# Patient Record
Sex: Female | Born: 1935 | Race: White | Hispanic: No | Marital: Married | Smoking: Former smoker
Health system: Southern US, Community
[De-identification: ages and names within clinical notes are randomized; demographics above are authoritative.]

## PROBLEM LIST (undated history)

## (undated) DIAGNOSIS — I10 Essential (primary) hypertension: Secondary | ICD-10-CM

## (undated) DIAGNOSIS — I482 Chronic atrial fibrillation, unspecified: Secondary | ICD-10-CM

## (undated) DIAGNOSIS — E785 Hyperlipidemia, unspecified: Secondary | ICD-10-CM

## (undated) DIAGNOSIS — I471 Supraventricular tachycardia: Secondary | ICD-10-CM

## (undated) DIAGNOSIS — F0390 Unspecified dementia without behavioral disturbance: Secondary | ICD-10-CM

## (undated) DIAGNOSIS — I071 Rheumatic tricuspid insufficiency: Secondary | ICD-10-CM

## (undated) DIAGNOSIS — I4719 Other supraventricular tachycardia: Secondary | ICD-10-CM

## (undated) DIAGNOSIS — I351 Nonrheumatic aortic (valve) insufficiency: Secondary | ICD-10-CM

## (undated) DIAGNOSIS — R4189 Other symptoms and signs involving cognitive functions and awareness: Secondary | ICD-10-CM

## (undated) DIAGNOSIS — I340 Nonrheumatic mitral (valve) insufficiency: Secondary | ICD-10-CM

## (undated) HISTORY — DX: Other symptoms and signs involving cognitive functions and awareness: R41.89

## (undated) HISTORY — DX: Essential (primary) hypertension: I10

## (undated) HISTORY — DX: Nonrheumatic mitral (valve) insufficiency: I34.0

## (undated) HISTORY — DX: Other supraventricular tachycardia: I47.19

## (undated) HISTORY — DX: Rheumatic tricuspid insufficiency: I07.1

## (undated) HISTORY — DX: Unspecified dementia, unspecified severity, without behavioral disturbance, psychotic disturbance, mood disturbance, and anxiety: F03.90

## (undated) HISTORY — PX: APPENDECTOMY: SHX54

## (undated) HISTORY — DX: Nonrheumatic aortic (valve) insufficiency: I35.1

## (undated) HISTORY — PX: SHOULDER SURGERY: SHX246

## (undated) HISTORY — DX: Hyperlipidemia, unspecified: E78.5

## (undated) HISTORY — DX: Supraventricular tachycardia: I47.1

## (undated) HISTORY — DX: Chronic atrial fibrillation, unspecified: I48.20

---

## 2005-11-14 ENCOUNTER — Ambulatory Visit: Payer: Self-pay | Admitting: Internal Medicine

## 2005-11-18 ENCOUNTER — Ambulatory Visit: Payer: Self-pay | Admitting: Internal Medicine

## 2005-11-18 ENCOUNTER — Encounter: Payer: Self-pay | Admitting: Internal Medicine

## 2005-11-18 ENCOUNTER — Ambulatory Visit: Payer: Self-pay

## 2005-12-29 ENCOUNTER — Ambulatory Visit: Payer: Self-pay | Admitting: Internal Medicine

## 2006-06-27 ENCOUNTER — Ambulatory Visit: Payer: Self-pay | Admitting: Internal Medicine

## 2006-07-12 ENCOUNTER — Ambulatory Visit: Payer: Self-pay | Admitting: Internal Medicine

## 2006-12-06 ENCOUNTER — Ambulatory Visit: Payer: Self-pay | Admitting: Internal Medicine

## 2007-06-19 ENCOUNTER — Ambulatory Visit: Payer: Self-pay | Admitting: Internal Medicine

## 2007-06-27 ENCOUNTER — Ambulatory Visit: Payer: Self-pay

## 2007-12-10 ENCOUNTER — Ambulatory Visit: Payer: Self-pay | Admitting: Internal Medicine

## 2008-06-19 ENCOUNTER — Ambulatory Visit: Payer: Self-pay | Admitting: Internal Medicine

## 2008-07-02 ENCOUNTER — Encounter: Payer: Self-pay | Admitting: Internal Medicine

## 2008-07-02 ENCOUNTER — Ambulatory Visit: Payer: Self-pay | Admitting: Internal Medicine

## 2008-07-02 ENCOUNTER — Ambulatory Visit: Payer: Self-pay

## 2009-01-25 DIAGNOSIS — E785 Hyperlipidemia, unspecified: Secondary | ICD-10-CM

## 2009-01-25 DIAGNOSIS — I10 Essential (primary) hypertension: Secondary | ICD-10-CM

## 2009-01-25 DIAGNOSIS — I4891 Unspecified atrial fibrillation: Secondary | ICD-10-CM

## 2009-01-26 ENCOUNTER — Ambulatory Visit: Payer: Self-pay | Admitting: Internal Medicine

## 2009-07-20 ENCOUNTER — Telehealth: Payer: Self-pay | Admitting: Internal Medicine

## 2009-09-03 ENCOUNTER — Ambulatory Visit: Payer: Self-pay | Admitting: Internal Medicine

## 2009-09-03 DIAGNOSIS — R413 Other amnesia: Secondary | ICD-10-CM

## 2009-11-17 ENCOUNTER — Encounter: Payer: Self-pay | Admitting: Internal Medicine

## 2010-03-03 ENCOUNTER — Ambulatory Visit: Payer: Self-pay | Admitting: Internal Medicine

## 2010-03-03 DIAGNOSIS — M79609 Pain in unspecified limb: Secondary | ICD-10-CM | POA: Insufficient documentation

## 2010-03-04 ENCOUNTER — Encounter: Payer: Self-pay | Admitting: Internal Medicine

## 2010-03-19 ENCOUNTER — Telehealth: Payer: Self-pay | Admitting: Internal Medicine

## 2010-09-03 ENCOUNTER — Ambulatory Visit: Payer: Self-pay | Admitting: Internal Medicine

## 2010-09-03 DIAGNOSIS — R0602 Shortness of breath: Secondary | ICD-10-CM

## 2010-09-06 LAB — CONVERTED CEMR LAB: Pro B Natriuretic peptide (BNP): 154.6 pg/mL — ABNORMAL HIGH (ref 0.0–100.0)

## 2010-09-15 ENCOUNTER — Ambulatory Visit: Payer: Self-pay

## 2010-09-15 ENCOUNTER — Ambulatory Visit: Payer: Self-pay | Admitting: Internal Medicine

## 2010-09-15 ENCOUNTER — Ambulatory Visit (HOSPITAL_COMMUNITY)
Admission: RE | Admit: 2010-09-15 | Discharge: 2010-09-15 | Payer: Self-pay | Source: Home / Self Care | Attending: Internal Medicine | Admitting: Internal Medicine

## 2010-09-15 ENCOUNTER — Encounter: Payer: Self-pay | Admitting: Internal Medicine

## 2010-11-02 NOTE — Letter (Signed)
Summary: [facility] Office Note  [facility] Office Note   Imported By: Roderic Ovens 12/04/2009 14:25:08  _____________________________________________________________________  External Attachment:    Type:   Image     Comment:   External Document

## 2010-11-02 NOTE — Assessment & Plan Note (Signed)
Summary: f41m    Visit Type:  Follow-up Primary Provider:  Dr. Nedra Hai   History of Present Illness: [name] is a delightful 77 year old woman with a history of chronic atrial fibrillation, hypertension, hyperlipidemia, and diabetes. She returns today for routine followup.   At last visit stopped digoxin. Doing very well. No CP. Remains active but gets SOB if she tries to walk too fast. No palpitations. No HF signs. Coumadin followed by Dr. Nedra Hai. No bleeding. Walking limited due to pain in knee. No claudication. Recently started on Namenda. BP doing well.   Had blood work last week with Dr. Nedra Hai and said it was ok.   Current Medications (verified): 1)  Warfarin Sodium .... Use As Directed By Anticoagualtion Clinic 2)  Hydrochlorothiazide 25 Mg Tabs (Hydrochlorothiazide) .... Take One Tablet By Mouth Daily. 3)  Zocor 40 Mg Tabs (Simvastatin) .Marland Kitchen.. 1 Once Daily 4)  Potassium Chloride Crys Cr 20 Meq Cr-Tabs (Potassium Chloride Crys Cr) .... Take One Tablet By Mouth Daily 5)  Metoprolol Tartrate 100 Mg Tabs (Metoprolol Tartrate) .... Two Times A Day 6)  Fish Oil   Oil (Fish Oil) .Marland Kitchen.. 1 By Mouth Daily 7)  Namenda 10 Mg Tabs (Memantine Hcl) .... Take 1 By Mouth Once Daily 8)  Tylenol Arthritis Pain 650 Mg Cr-Tabs (Acetaminophen) .... Take 2 By Mouth Once Daily 9)  Losartan Potassium 25 Mg Tabs (Losartan Potassium) .... Take 1 Tablet By Mouth Once A Day  Allergies: 1)  ! Jonne Ply  Past History:  Past Medical History: Last updated: 01/25/2009  1. Chronic atrial fibrillation     --Echo 9/09 EF 60%. mild MR  2. HTN  3. Hyperlipidemia  4. Diabetes  5. Myoview 2008: EF 70% normal perfusion  Review of Systems       As per HPI and past medical history; otherwise all systems negative.   Vital Signs:  Patient profile:   77 year old female Height:      64 inches Weight:      159 pounds BMI:     27.39 Pulse rate:   96 / minute Pulse rhythm:   irregularly irregular BP sitting:   124 / 62  (left  arm)  Vitals Entered By: Laurance Flatten CMA (September 03, 2010 11:41 AM)  Physical Exam  General:  Well appearing. no resp difficulty HEENT: normal Neck: supple. JVP 6. Carotids 2+ bilat; no bruits.  Cor: PMI nondisplaced. Irregular rate & rhythm. No rubs, gallops, murmur. Lungs: clear Abdomen: soft, nontender, nondistended. No hepatosplenomegaly. No bruits or masses. Good bowel sounds. Extremities: no cyanosis, clubbing, rash,+varicose veins  Neuro: alert & orientedx3, cranial nerves grossly intact. moves all 4 extremities w/o difficulty. affect pleasant    Impression & Recommendations:  Problem # 1:  ATRIAL FIBRILLATION (ICD-427.31) Chronic. Stable. Rate controlled. Continue coumadin.  Problem # 2:  DYSPNEA (ICD-786.05) Unclear etiology. Will check echo, CXR, TSH, and BNP.   Problem # 3:  HYPERTENSION, BENIGN (ICD-401.1) Blood pressure well controlled. Continue current regimen.  Other Orders: EKG w/ Interpretation (93000) Echocardiogram (Echo) T-2 View CXR (71020TC) TLB-BNP (B-Natriuretic Peptide) (83880-BNPR)  Patient Instructions: 1)  Your physician recommends that you schedule a follow-up appointment in: 6 months 2)  Your physician recommends that you continue on your current medications as directed. Please refer to the Current Medication list given to you today.  Appended Document: f27m    Clinical Lists Changes  Observations: Added new observation of PI CARDIO: Your physician recommends that you schedule a follow-up appointment in:  6 months Your physician recommends that you continue on your current medications as directed. Please refer to the Current Medication list given to you today. A chest x-ray takes a picture of the organs and structures inside the chest, including the heart, lungs, and blood vessels. This test can show several things, including, whether the heart is enlarged; whether fluid is building up in the lungs; and whether pacemaker / defibrillator  leads are still in place. You have prescription to have this done at [facility] Your physician has requested that you have an echocardiogram.  Echocardiography is a painless test that uses sound waves to create images of your heart. It provides your doctor with information about the size and shape of your heart and how well your heart's chambers and valves are working.  This procedure takes approximately one hour. There are no restrictions for this procedure. (09/03/2010 12:39)       Patient Instructions: 1)  Your physician recommends that you schedule a follow-up appointment in: 6 months 2)  Your physician recommends that you continue on your current medications as directed. Please refer to the Current Medication list given to you today. 3)  A chest x-ray takes a picture of the organs and structures inside the chest, including the heart, lungs, and blood vessels. This test can show several things, including, whether the heart is enlarged; whether fluid is building up in the lungs; and whether pacemaker / defibrillator leads are still in place. You have prescription to have this done at [facility] 4)  Your physician has requested that you have an echocardiogram.  Echocardiography is a painless test that uses sound waves to create images of your heart. It provides your doctor with information about the size and shape of your heart and how well your heart's chambers and valves are working.  This procedure takes approximately one hour. There are no restrictions for this procedure.  Appended Document: f44m Pt to have TSH done on September 15, 2010 when here for echo

## 2010-11-02 NOTE — Assessment & Plan Note (Signed)
Summary: f12m   Primary Provider:  Dr. Nedra Hai  CC:  ROV; No complaints.  History of Present Illness: [name] is a delightful 77 year old woman with a history of chronic atrial fibrillation, hypertension, hyperlipidemia, and diabetes. She returns today for routine followup.   Doing very well. No CP/SOB. Remains active. No palpitations. No HF signs. Coumadin followed by Dr. Nedra Hai. No bleeding. Having some vein work on her left leg. Walking limited due to pain in L shin. No claudication. Recently started on Namenda. BP doing well.   Current Medications (verified): 1)  Digoxin 0.125 Mg Tabs (Digoxin) .Marland Kitchen.. 1 Once Daily 2)  Warfarin Sodium .... Use As Directed By [facility]ticoag[facility]tion Clinic 3)  Hydrochlorothiazide 25 Mg Tabs (Hydrochlorothiazide) .... Take One Tablet By Mouth Daily. 4)  Zocor 40 Mg Tabs (Simvastatin) .Marland Kitchen.. 1 Once Daily 5)  Potassium Chloride Crys Cr 20 Meq Cr-Tabs (Potassium Chloride Crys Cr) .... Take One Tablet By Mouth Daily 6)  Metoprolol Tartrate 100 Mg Tabs (Metoprolol Tartrate) .... Two Times A Day 7)  Fish Oil   Oil (Fish Oil) .Marland Kitchen.. 1 By Mouth Daily 8)  Namenda 10 Mg Tabs (Memantine Hcl) .... Take 1 By Mouth Once Daily 9)  Tylenol Arthritis Pain 650 Mg Cr-Tabs (Acetaminophen) .... Take 2 By Mouth Once Daily  Allergies: 1)  ! Jonne Ply  Past History:  Past Medical History: Last updated: 01/25/2009  1. Chronic atrial fibrillation     --Echo 9/09 EF 60%. mild MR  2. HTN  3. Hyperlipidemia  4. Diabetes  5. Myoview 2008: EF 70% normal perfusion  Review of Systems       As per HPI and past medical history; otherwise all systems negative.   Vital Signs:  Patient profile:   77 year old female Height:      64 inches Weight:      160 pounds BMI:     27.56 Pulse rate:   76 / minute Pulse rhythm:   irregular BP sitting:   152 / 64  (left arm) Cuff size:   regular  Vitals Entered By: Stanton Kidney, EMT-P (March 03, 2010 11:36 AM)  Physical Exam  General:  Gen: well appearing.  no resp difficulty HEENT: normal Neck: supple. no JVD. Carotids 2+ bilat; no bruits.  Cor: PMI nondisplaced. Irregular rate & rhythm. No rubs, gallops, murmur. Lungs: clear Abdomen: soft, nontender, nondistended. No hepatosplenomegaly. No bruits or masses. Good bowel sounds. Extremities: no cyanosis, clubbing, rash,+varicose veins L > R. now with compression stocking on L leg. tender to palption on left shin Neuro: alert & orientedx3, cranial nerves grossly intact. moves all 4 extremities w/o difficulty. affect pleasant    Impression & Recommendations:  Problem # 1:  ATRIAL FIBRILLATION (ICD-427.31) Chronic. Stable. Will stop digoxin. Continue coumadin.  Problem # 2:  HYPERTENSION, BENIGN (ICD-401.1) BP up a bit. Start cozaar 25 once daily .Check BMET in 1 week.   Problem # 3:  LIMB PAIN (ICD-729.5) Will check LLE x-ray. If pain persists send to ortho.   Other Orders: EKG w/ Interpretation (93000)  Patient Instructions: 1)  Stop Digoxin 2)  Start Losartan 25mg  daily 3)  We have given you a prescription for an x-ray of your leg to have doen in [place] 4)  Follow up in 6 months Prescriptions: LOSARTAN POTASSIUM 25 MG TABS (LOSARTAN POTASSIUM) Take 1 tablet by mouth once a day  #90 x 3   Entered by:   Meredith Staggers, RN   Authorized by:   Dolores Patty,  MD, [facility]   Signed by:   Meredith Staggers, RN on 03/03/2010   Method used:   Electronically to        [facility]* (mail-order)             ,          Ph: [phone]       Fax: [phone]   RxID:   [id number]

## 2010-11-02 NOTE — Progress Notes (Signed)
Summary: RESULTS FROM XRAY OF LEG   Phone Note Call from Patient Call back at Home Phone [phone]   Caller: Patient Summary of Call: PT CALLING FOR XRAY RESULTS OF THE PT LEG Initial call taken by: Judie Grieve,  March 19, 2010 11:37 AM  Follow-up for Phone Call        pt had xray at [facility], will call for results Meredith Staggers, RN  March 19, 2010 3:13 PM   Received xray report, shows no acute findings, degenerative joint disease, called pt and left mess to call back Meredith Staggers, RN  March 22, 2010 9:24 AM   pt aware of results Meredith Staggers, RN  March 22, 2010 1:25 PM

## 2011-02-11 ENCOUNTER — Other Ambulatory Visit: Payer: Self-pay | Admitting: Internal Medicine

## 2011-02-15 NOTE — Assessment & Plan Note (Signed)
[facility] HEALTHCARE                            CARDIOLOGY OFFICE NOTE   [name], [name]                       MRN:          [id number]  DATE:06/19/2008                            DOB:          July 24, 1925    PRIMARY CARE PHYSICIAN:  Trellis Paganini, MD   HISTORY:  [name] is a delightful 77 year old woman with a history of  chronic atrial fibrillation, hypertension, hyperlipidemia, and diabetes.  She returns today for routine followup.   Overall, she continues to do great.  She is active with mall walking  with no chest pain, dyspnea, or palpitations.  She has not had any  syncope or presyncope.  She had a Myoview back in September 2008, which  was normal.  Ejection fraction has been 70% by echocardiogram.   At her last visit, we did increase her Toprol to 75 b.i.d. as her  resting heart rate was in the mid 90s.   CURRENT MEDICATIONS:  1. Digoxin 125 mcg a day.  2. Coumadin.  3. Hydrochlorothiazide 25 a day.  4. Zocor 40 a day.  5. Potassium 20 a day.  6. Toprol 75 b.i.d.   PHYSICAL EXAMINATION:  GENERAL:  She is well appearing in no acute  distress, ambulates around the clinic without any respiratory  difficulty.  She looks younger than her stated age.  VITAL SIGNS:  Blood pressure is 125/63, heart rate is 101, and weight is  159.  HEENT:  Normal.  NECK:  Supple.  No JVD.  Carotids are 2+ bilaterally without any bruits.  There is no lymphadenopathy or thyromegaly.  CARDIAC:  PMI is nondisplaced.  She is irregularly regular.  No murmurs,  rubs, or gallops.  LUNGS:  Clear.  ABDOMEN:  Soft, nontender, and nondistended.  No hepatosplenomegaly, no  bruits, or no masses.  Good bowel sounds.  EXTREMITIES:  Warm with no cyanosis, clubbing, or edema.  No rash.  Trace lower extremity edema.  NEUROLOGICAL:  Alert and oriented x3.  Cranial nerves II through XII are  intact.  Moves all four extremities without difficulty.  Affect is very  pleasant.   EKG  shows atrial fibrillation at a rate of 101.  There is LVH and  occasional PVCs.  Nonspecific ST-T wave abnormalities.   ASSESSMENT AND PLAN:  1. Atrial fibrillation.  Once again her rate is elevated.  We will go      ahead and increase her Toprol to 100 b.i.d. and put a 48-hour      Holter monitor on her and see if this is same at home or just      because she is here in the office.  If her rates continued to be      elevated, I think, she will probably need to start some Cardizem.      We are also going to check an echocardiogram to make sure her left      ventricular function is stable.  2. Hyperlipidemia.  We will get a fasting lipid panel.  3. Hypertension.  Blood pressure looks great.  Continue to follow.   DISPOSITION:  Overall, doing very well.  We will see her back in 6  months.  Further disposition pending the results of her testing.     [name]. Bensimhon, MD  Electronically Signed    DRB/MedQ  DD: 06/19/2008  DT: 06/20/2008  Job #: [id number]   cc:   [name]

## 2011-02-15 NOTE — Assessment & Plan Note (Signed)
[facility]ALTHCARE                            CARDIOLOGY OFFICE NOTE   NAME:[name] [name]                       MRN:          [id number]  DATE:06/19/2007                            DOB:          [birth year]    PRIMARY CARE PHYSICIAN:  Dr. Simone [name], in [place].   INTERVAL HISTORY:  [name] [name] is a delightful 77 year old woman with a  history of chronic atrial fibrillation, hypertension, hyperlipidemia and  diabetes who returns today for routine followup.   [name] is doing great.  [name] remains very active, mall walking with her  husband about 2 miles a day.  No chest pain or shortness of breath.  [name]  has not had any syncope or presyncope.  [name] has had good heart rate  control with her A-fib.  [name] did have a Myoview about 6 years ago which  was negative for ischemia.   CURRENT MEDICATIONS:  1. Digoxin 125 mcg a day.  2. Coumadin.  3. Toprol 100 a day.  4. HCTZ 25 a day.  5. Zocor 40 a day.  6. Potassium 20 a day.   PHYSICAL EXAM:  [name] is an elderly woman in no acute distress.  Ambulates  around the clinic without any respiratory difficulty.  [name] appears  younger than her stated age.  Blood pressure is 127/74, heart rate is  89, weight is 160.  HEENT:  Normal.  NECK:  Supple, no JVD.  Carotids are 2+ bilaterally without bruits.  There is no lymphadenopathy or thyromegaly.  CARDIAC:  PMI is nondisplaced.  [name] is irregularly irregular with no  murmurs, rubs or gallops.  LUNGS:  Clear.  ABDOMEN:  Soft, nontender, nondistended, good bowel sounds, no  hepatosplenomegaly, no bruits, no masses.  EXTREMITIES:  Warm with no cyanosis, clubbing or edema.  No rashes.  NEURO:  [name] is alert and oriented x3, cranial nerves II through XII are  intact and moves all 4 extremities without difficulty.   EKG shows chronic atrial fibrillation at a rate of 89 bpm with  nonspecific ST-T wave abnormalities and also occasional PVC.   ASSESSMENT AND PLAN:  1. Atrial  fibrillation.  [name] is doing well, continue rate control and      Coumadin.  This is followed by [name] [name].  2. Hypertension.  Blood pressure is well controlled today,  continue      current therapy.  3. Cardiovascular risk screening.  We will proceed with a repeat      Myoview given the fact that her last test was over 5 years ago and      [name] has significant risk factors.   DISPOSITION:  I will see her back for routine followup in 6 months.     [name] Buckles. Bensimhon, MD  Electronically Signed    DRB/MedQ  DD: 06/19/2007  DT: 06/19/2007  Job #: [id number]   cc:   [name] [name]

## 2011-02-15 NOTE — Assessment & Plan Note (Signed)
[facility] HEALTHCARE                            CARDIOLOGY OFFICE NOTE   [name], [name]                       MRN:          [id number]  DATE:12/10/2007                            DOB:          Jan 30, 1925    PRIMARY CARE PHYSICIAN:  Simone Curia, M.D.   INTERVAL HISTORY:  [name] is a delightful 77 year old woman with a  history of chronic atrial fibrillation, hypertension, hyperlipidemia and  diabetes who returns today for routine follow up.   Overall, she is doing great.  She remains very active, walking in the  mall, at least 2 miles 4-5 days a week.  No chest pain or shortness of  breath.  No palpitations.  No syncope or presyncope.  She did have a  recent screening Myoview back in September of 2008 which  did not show  any evidence of scar or ischemia.  It was not gated due to her atrial  fibrillation.  Echocardiogram in 2007 showed an EF of 70%.   CURRENT MEDICATIONS:  1. Digoxin 125 mcg a day.  2. Warfarin.  3. Toprol 100 mg a day.  4. HCTZ 25 a day.  5. Zocor 40 a day.  6. Potassium 20 a day.   PHYSICAL EXAMINATION:  GENERAL:  She is in no acute distress.  Ambulates  around the clinic without any respiratory difficulty.  She looks younger  than her stated age.  VITAL SIGNS:  Blood pressure is 136/78, heart rate is 98, weights is  158.  HEENT:  Normal.  NECK:  Supple.  There is no JVD.  Carotids are 2+ bilaterally without  any bruits.  There is no lymphadenopathy or thyromegaly.  CARDIAC:  PMI is not displaced.  She is irregularly irregular.  No  murmurs, rubs or gallops.  LUNGS:  Clear.  ABDOMEN:  Soft, nontender, nondistended.  There is no  hepatosplenomegaly, no bruits, no masses.  Good bowel sounds.  EXTREMITIES:  Warm with no cyanosis, clubbing or edema.  No rash.  NEUROLOGIC:  Alert and oriented x3.  Cranial nerves II-XII are intact.  Moves all 4 extremities without difficulty.  Affect is pleasant.   ELECTROCARDIOGRAM:  EKG shows  chronic atrial fibrillation at a rate of  92 with nonspecific ST-T wave abnormalities.   ASSESSMENT/PLAN:  1. Atrial fibrillation.  This is chronic.  Her rate is up just a      little bit today.  Given her hypertension, I do think that probably      the best thing to do is increase her Lopressor.  Given the      __________ of Toprol, will switch her over to Lopressor 75 b.i.d.      Continue Coumadin.  I did talk to her about the __________ trial.      However, given the distance it is for her to travel from [place],      she has refused.  2. Hyperlipidemia.  Continue Zocor.  This is followed by Dr. Nedra Hai.      Goal LDL is less than 100.  3. Hypertension, just mildly elevated.  We are  increasing her beta-      blocker today.  We will see how she does.  Goal is to get her      systolic blood pressure under 161.   DISPOSITION:  I will see her back in 9 months to a year for routine  follow up.     Bevelyn Buckles. Bensimhon, MD  Electronically Signed    DRB/MedQ  DD: 12/10/2007  DT: 12/11/2007  Job #: [id number]   cc:   Simone Curia

## 2011-02-18 NOTE — Assessment & Plan Note (Signed)
[facility] HEALTHCARE                            CARDIOLOGY OFFICE NOTE   MAEKAYLA, GIORGIO                       MRN:          [id number]  DATE:12/06/2006                            DOB:          1925/01/31    INTERVAL HISTORY:  Mrs. Colebank is a delightful 77 year old woman with a  history of chronic atrial fibrillation, as well as hypertension,  hyperlipidemia, and diabetes, who returns today for routine follow up of  her chronic atrial fibrillation.   She tells me that she has been very active. She goes to the mall several  times a week and walks for almost 2 miles without any difficulty. She  denies any chest pain, no shortness of breath, no palpitations, no  syncope or pre-syncope. She previously wore a Holter monitor which  showed chronic atrial fibrillation with a mean ventricular response of  80. She has been also checking her blood pressure regularly and has been  running in the 120/70 range.   PHYSICAL EXAMINATION:  GENERAL: She is an elderly woman in no acute  distress. She ambulates around the clinic without any respiratory  difficulty.  VITAL SIGNS: Blood pressure 138/72, heart rate 96, weight 159.  HEENT: Sclerae anicteric, EO MI, there is no xanthelasma, mucous  membranes are moist, oral pharynx is clear.  NECK: Supple. There is no JVD. Carotids are 2+ bilaterally without any  bruits. There is no lymphadenopathy or thyromegaly.  CARDIAC: Irregular regular with no murmurs, rubs, or gallops.  LUNGS: Clear.  ABDOMEN: Soft and nontender, nondistended with good bowel sounds. No  bruits or masses.  EXTREMITIES: Warm with no cyanosis, clubbing, or edema. No rashes.  NEUROLOGIC: She is alert and oriented x3. Cranial nerves 2-12 are  intact. She can move all 4 extremities without difficulty.   EKG shows chronic atrial fibrillation with a ventricular response of 96  with LVH and non-specific STT wave changes.   ASSESSMENT AND PLAN:  1. Atrial  fibrillation. She is doing quite well with rate control. She      is being maintained on Coumadin for anticoagulation without any      bleedings, although her ventricular response is a bit on the high      side, we have documentation by Holter monitoring that she has been      well controlled. We will continue the current therapy.  2. Hypertension. Blood pressure is slightly elevated today, but has      been well controlled at home. She will continue to follow with Dr.      Nedra Hai for this.   DISPOSITION:  We will see her back for routine in 6 months.     Bevelyn Buckles. Bensimhon, MD  Electronically Signed    DRB/MedQ  DD: 12/06/2006  DT: 12/07/2006  Job #: [id number]   cc:   Simone Curia

## 2011-02-18 NOTE — Assessment & Plan Note (Signed)
[facility]                              CARDIOLOGY OFFICE NOTE   NAME:[name], [name] ROBIDEAU                       MRN:          [id number]  DATE:06/27/2006                            DOB:          12-22-1924    PRIMARY CARE PHYSICIAN:  Dr. Simone [name] at [facility].   PATIENT IDENTIFICATION:  [name] is a delightful 77 year old woman who  comes for routine followup.   PROBLEM:  1. Atrial fibrillation proceeding with rate control.      a.     Echocardiogram February 2007, EF of 70%, mild aortic       insufficiency.      b.     48-hour Holter February 2007, chronic atrial fibrillation, mean       heart rate of 80, peak heart rate 135.  This was nonsustained.  2. Hypertension.  3. Hyperlipidemia.  4. Diabetes.  5. History of CVA approximately 3-4 years ago.  6. Osteoarthritis.  7. Adenosine Myoview April 2002.  EF 62% with questionable small defect in      the apical anterior septum.  Question ischemia verses attenuation.  Low      risk study.   CURRENT MEDICATIONS:  1. Lipitor 40 a day.  2. Atenolol 50/HCTZ 25.  3. Digoxin 0.125 a day.  4. Coumadin.   MEDICATION ALLERGIES:  ASPIRIN which causes GI upset.   INTERVAL HISTORY:  [name] returns today with her husband for routine  followup.  She has been doing fantastic.  She is walking the mall about a  mile and a half 3 days a week without any chest pain or shortness of breath.  She denies any palpitations, syncope or presyncope, no heart failure  symptoms.  She is somewhat concerned about the cost of her medications.   PHYSICAL EXAMINATION:  She is well-appearing, no acute distress.  Ambulates  around the clinic without any respiratory difficulty.  Blood pressure is 122/68.  Heart rate is 70.  Her weight is 153.  HEENT:  Sclerae anicteric.  EOMI:  No xanthelasma, mucous membranes are moist.  NECK:  Is supple.  No JVD.  Carotids 2+ bilaterally without any bruits.  There  is no lymphadenopathy or thyromegaly.  CARDIAC:  Irregularly irregular.  No murmurs, rubs or gallops.  LUNGS:  Clear.  ABDOMEN:  Is soft, nontender, nondistended with good bowel sounds.  No  bruits or masses.  EXTREMITIES:  Are warm, no cyanosis, clubbing or edema.  NEUROLOGIC:  She is alert and oriented x3.  Cranial nerves II through XII  are intact.  Moves all 4 extremities without any difficulty.   EKG shows atrial fibrillation with a ventricular response of about the 81  beats per minute.  Nonspecific ST changes and occasional PVCs.   ASSESSMENT AND PLAN:  1. Chronic atrial fibrillation.  She is doing well.  Her rate control is      relatively good, however I am not a big fan of Atenolol based on      comparative hypertensive studies and her borderline creatinine  clearance.  I would prefer to have her on metoprolol which is      metabolized by the liver.  We will switch her over to Toprol 100 mg a      day and hydrochlorothiazide 25 a day.  We will bring her back in two      weeks for a blood pressure check and an EKG.  We will consider her for      enrollment in the [employer] Atrial Fibrillation Study.  2. Hyperlipidemia on Lipitor.  We will change to Zocor 40 mg a day due to      cost.  This is followed by Dr. Nedra [name].  3. Electrolytes.  She is on hydrochlorothiazide, previous potassium was      3.5.  We will check a BMET when she comes back but I suspect we will      need to put her on some low dose potassium supplementation, such as 20      mEq a day.   DISPOSITION:  Return to clinic in 6 months for routine followup.                                Bevelyn Buckles. Bensimhon, MD    DRB/MedQ  DD:  06/27/2006  DT:  06/29/2006  Job #:  [id number]   cc:   [name]

## 2011-03-01 ENCOUNTER — Encounter: Payer: Self-pay | Admitting: Internal Medicine

## 2011-03-14 ENCOUNTER — Ambulatory Visit (INDEPENDENT_AMBULATORY_CARE_PROVIDER_SITE_OTHER): Payer: Medicare Other | Admitting: Internal Medicine

## 2011-03-14 ENCOUNTER — Encounter: Payer: Self-pay | Admitting: Internal Medicine

## 2011-03-14 VITALS — BP 134/80 | HR 87 | Ht 64.0 in | Wt 170.8 lb

## 2011-03-14 DIAGNOSIS — I5031 Acute diastolic (congestive) heart failure: Secondary | ICD-10-CM

## 2011-03-14 DIAGNOSIS — R609 Edema, unspecified: Secondary | ICD-10-CM

## 2011-03-14 DIAGNOSIS — R0602 Shortness of breath: Secondary | ICD-10-CM

## 2011-03-14 DIAGNOSIS — I10 Essential (primary) hypertension: Secondary | ICD-10-CM

## 2011-03-14 DIAGNOSIS — I4891 Unspecified atrial fibrillation: Secondary | ICD-10-CM

## 2011-03-14 MED ORDER — FUROSEMIDE 40 MG PO TABS: 40.0000 mg | ORAL_TABLET | Freq: Every day | ORAL | Status: DC

## 2011-03-14 MED ORDER — POTASSIUM CHLORIDE CRYS ER 20 MEQ PO TBCR: 40.0000 meq | EXTENDED_RELEASE_TABLET | Freq: Every day | ORAL | Status: DC

## 2011-03-14 NOTE — Patient Instructions (Signed)
Start Furosemide 40 mg daily Increase Potassium to 2 tabs daily Your physician recommends that you return for lab work in: 1 week (bmet, bnp 428.31) Your physician recommends that you schedule a follow-up appointment in: end of next week with Tereso Newcomer, PA Your physician recommends that you schedule a follow-up appointment in: 1 month with Dr Gala Romney

## 2011-03-14 NOTE — Progress Notes (Signed)
Addended by: Noralee Space on: 03/14/2011 12:26 PM   Modules accepted: Orders

## 2011-03-14 NOTE — Assessment & Plan Note (Signed)
Blood pressure well controlled. Continue current regimen.  

## 2011-03-14 NOTE — Assessment & Plan Note (Signed)
Chronic. Rate controlled. Continue coumadin. 

## 2011-03-14 NOTE — Progress Notes (Signed)
HPI:  [name] is a delightful 77 year old woman with a history of chronic atrial fibrillation, hypertension, hyperlipidemia, mild dementia and diabetes. She returns today for routine followup.  Had echo in 12/11. Normal LV function. Mild to moderate AI. BNP mildly elevated 154.  Feels like her dyspnea is getting worse. No CP. Remains active but gets SOB if she tries to do anything. No palpitations. Weight up 11 pounds. +LE edema  No orthopnea or PND. Coumadin followed by Dr. Nedra Hai.    ROS: All systems negative except as listed in HPI, PMH and Problem List.  Past Medical History  Diagnosis Date  . Chronic atrial fibrillation     echo 9/09 EF 60%, mild MR  . HTN (hypertension)   . Hyperlipidemia   . Diabetes mellitus     Current Outpatient Prescriptions  Medication Sig Dispense Refill  . acetaminophen (TYLENOL) 650 MG CR tablet Take 1,300 mg by mouth daily.        . fish oil-omega-3 fatty acids 1000 MG capsule Take 1 g by mouth daily.        . hydrochlorothiazide 25 MG tablet Take 25 mg by mouth daily.        Marland Kitchen losartan (COZAAR) 25 MG tablet TAKE 1 TABLET DAILY  90 tablet  2  . memantine (NAMENDA) 10 MG tablet Take 10 mg by mouth 2 (two) times daily.        . metoprolol (LOPRESSOR) 100 MG tablet Take 100 mg by mouth 2 (two) times daily.        . potassium chloride SA (K-DUR,KLOR-CON) 20 MEQ tablet Take 20 mEq by mouth daily.        . simvastatin (ZOCOR) 40 MG tablet Take 40 mg by mouth daily.        Marland Kitchen warfarin (COUMADIN) 5 MG tablet Take 5 mg by mouth daily. Use as directed by Anticoagulation clinic.          PHYSICAL EXAM: Filed Vitals:   03/14/11 0935  BP: 134/80  Pulse: 87   General:  Well appearing. No resp difficulty HEENT: normal Neck: supple. JVP 7-8  Carotids 2+ bilaterally; no bruits. No lymphadenopathy or thryomegaly appreciated. Cor: PMI normal. Regular rate & rhythm. No rubs, gallops or murmurs. Lungs: bibasilar crackles Abdomen: soft, nontender, nondistended. No  hepatosplenomegaly. No bruits or masses. Good bowel sounds. Extremities: no cyanosis, clubbing, rash, 2+ edema R>L Neuro: alert & orientedx3, cranial nerves grossly intact. Moves all 4 extremities w/o difficulty. Affect pleasant.    ECG: AF 87 +LVH. No ST-T wave abnormalities.     ASSESSMENT & PLAN:

## 2011-03-14 NOTE — Assessment & Plan Note (Signed)
Has evidence of volume overload. Will start lasix 40 qd + kcl 20. Will see back in 1-2 weeks to see if she is improving.Knows to call me if getting worse.

## 2011-03-21 ENCOUNTER — Other Ambulatory Visit (INDEPENDENT_AMBULATORY_CARE_PROVIDER_SITE_OTHER): Payer: Medicare Other | Admitting: *Deleted

## 2011-03-21 DIAGNOSIS — R609 Edema, unspecified: Secondary | ICD-10-CM

## 2011-03-21 DIAGNOSIS — I10 Essential (primary) hypertension: Secondary | ICD-10-CM

## 2011-03-21 DIAGNOSIS — I5031 Acute diastolic (congestive) heart failure: Secondary | ICD-10-CM

## 2011-03-21 DIAGNOSIS — R0602 Shortness of breath: Secondary | ICD-10-CM

## 2011-03-21 LAB — BASIC METABOLIC PANEL
Chloride: 95 mEq/L — ABNORMAL LOW (ref 96–112)
GFR: 44.2 mL/min — ABNORMAL LOW (ref >60.00)
Glucose, Bld: 116 mg/dL — ABNORMAL HIGH (ref 70–99)
Potassium: 4.4 mEq/L (ref 3.5–5.1)
Sodium: 136 mEq/L (ref 135–145)

## 2011-03-21 LAB — BRAIN NATRIURETIC PEPTIDE: Pro B Natriuretic peptide (BNP): 118 pg/mL — ABNORMAL HIGH (ref 0.0–100.0)

## 2011-03-25 ENCOUNTER — Encounter: Payer: Self-pay | Admitting: Physician Assistant

## 2011-03-25 ENCOUNTER — Ambulatory Visit (INDEPENDENT_AMBULATORY_CARE_PROVIDER_SITE_OTHER): Payer: Medicare Other | Admitting: Physician Assistant

## 2011-03-25 VITALS — BP 109/66 | HR 80 | Ht 64.0 in | Wt 165.8 lb

## 2011-03-25 DIAGNOSIS — I5032 Chronic diastolic (congestive) heart failure: Secondary | ICD-10-CM | POA: Insufficient documentation

## 2011-03-25 DIAGNOSIS — I509 Heart failure, unspecified: Secondary | ICD-10-CM

## 2011-03-25 DIAGNOSIS — I4891 Unspecified atrial fibrillation: Secondary | ICD-10-CM

## 2011-03-25 NOTE — Patient Instructions (Signed)
Your physician recommends that you schedule a follow-up appointment in: 05/11/11 @ 12:00 with Dr. Gala Romney  A PRESCRIPTION WAS GIVEN TO YOU TODAY TO HAVE SOME BLOOD DONE @ YOUR PRIMARY CARE PHYSICIAN, PLEAS EHAVE THEM FAX RESULTS TO [place], PA-C [phone]

## 2011-03-25 NOTE — Progress Notes (Signed)
HPI: Primary Cardiologist:  Dr. Margaretha Seeds is a delightful 77 year old woman with a history of chronic atrial fibrillation, hypertension, hyperlipidemia, mild dementia and diabetes. Had echo in 12/11:  Normal LV function, mild to moderate AI, mild MR, mod to severe LAE, mod RAE, mod TR, PASP 33.  She saw Dr. Gala Romney last week and was noted to be volume overloaded.  He placed her on Lasix 40 mg along with potassium 20 mEq a day.  Followup labs earlier this week demonstrated a potassium of 4.4 and creatinine of 1.2 and BNP 118.  She returns for followup.  Her breathing is much improved.  She was able to go to the mall and walk yesterday without difficulty.  She denies orthopnea, PND or chest pain.  Her edema is much improved.  She denies syncope.  She denies lightheadedness.  Past Medical History  Diagnosis Date  . Chronic atrial fibrillation     echo 9/09 EF 60%, mild MR  . HTN (hypertension)   . Hyperlipidemia   . Diabetes mellitus     Current Outpatient Prescriptions  Medication Sig Dispense Refill  . acetaminophen (TYLENOL) 650 MG CR tablet Take 1,300 mg by mouth daily.        . furosemide (LASIX) 40 MG tablet Take 1 tablet (40 mg total) by mouth daily.  90 tablet  3  . hydrochlorothiazide 25 MG tablet Take 25 mg by mouth daily.        Marland Kitchen losartan (COZAAR) 25 MG tablet TAKE 1 TABLET DAILY  90 tablet  2  . memantine (NAMENDA) 10 MG tablet Take 10 mg by mouth 2 (two) times daily.        . metoprolol (LOPRESSOR) 100 MG tablet Take 100 mg by mouth 2 (two) times daily.        . potassium chloride SA (K-DUR,KLOR-CON) 20 MEQ tablet Take 2 tablets (40 mEq total) by mouth daily.  180 tablet  3  . simvastatin (ZOCOR) 40 MG tablet Take 40 mg by mouth daily.        Marland Kitchen warfarin (COUMADIN) 5 MG tablet Take 5 mg by mouth daily. Use as directed by Anticoagulation clinic.       . fish oil-omega-3 fatty acids 1000 MG capsule Take 1 g by mouth daily.           Vital Signs: BP 109/66  Pulse  80  Ht 5\' 4"  (1.626 m)  Wt 165 lb 12.8 oz (75.206 kg)  BMI 28.46 kg/m2  PHYSICAL EXAM: Well nourished, well developed, in no acute distress HEENT: normal Neck: JVP about 5 cm Cardiac:  normal S1, S2; RRR; no murmur, no gallop Lungs: bibasilar crackles, no wheezing Abd: soft, nontender, no hepatomegaly Ext: trace bilateral edema Skin: warm and dry Neuro:  CNs 2-12 intact, no focal abnormalities noted  EKG:  Atrial fibrillation, heart rate 80, normal axis, PVC, nonspecific ST-T wave changes  ASSESSMENT AND PLAN:

## 2011-03-25 NOTE — Assessment & Plan Note (Signed)
Rate control.  Continue Coumadin. 

## 2011-03-25 NOTE — Assessment & Plan Note (Signed)
Her weight is down 5 pounds.  Her breathing is improved.  Continue current medications.  Recent blood work demonstrated stable renal function and potassium.  She has follow up with her PCP next week.  I will get another basic metabolic panel at that time.  She will keep her follow up with Dr. Gala Romney in August.  She has been instructed to weigh herself daily.  If she notes 2-3 pounds of weight gain or increased swelling or increased shortness of breath, she should contact our office.

## 2011-04-04 ENCOUNTER — Telehealth: Payer: Self-pay | Admitting: *Deleted

## 2011-04-04 ENCOUNTER — Encounter: Payer: Self-pay | Admitting: Physician Assistant

## 2011-04-04 DIAGNOSIS — I5032 Chronic diastolic (congestive) heart failure: Secondary | ICD-10-CM

## 2011-04-04 NOTE — Telephone Encounter (Signed)
Pt aware of lab and repeat labs 04/18/11

## 2011-04-14 ENCOUNTER — Other Ambulatory Visit (INDEPENDENT_AMBULATORY_CARE_PROVIDER_SITE_OTHER): Payer: Medicare Other | Admitting: *Deleted

## 2011-04-14 DIAGNOSIS — I5032 Chronic diastolic (congestive) heart failure: Secondary | ICD-10-CM

## 2011-04-14 DIAGNOSIS — I509 Heart failure, unspecified: Secondary | ICD-10-CM

## 2011-04-14 LAB — BASIC METABOLIC PANEL
Chloride: 99 mEq/L (ref 96–112)
Potassium: 3.3 mEq/L — ABNORMAL LOW (ref 3.5–5.1)

## 2011-04-18 ENCOUNTER — Other Ambulatory Visit: Payer: Medicare Other | Admitting: *Deleted

## 2011-05-03 ENCOUNTER — Encounter: Payer: Self-pay | Admitting: Internal Medicine

## 2011-05-11 ENCOUNTER — Encounter: Payer: Self-pay | Admitting: Internal Medicine

## 2011-05-11 ENCOUNTER — Ambulatory Visit (INDEPENDENT_AMBULATORY_CARE_PROVIDER_SITE_OTHER): Payer: Medicare Other | Admitting: Internal Medicine

## 2011-05-11 VITALS — BP 113/75 | HR 90 | Ht 63.0 in | Wt 165.0 lb

## 2011-05-11 DIAGNOSIS — I5032 Chronic diastolic (congestive) heart failure: Secondary | ICD-10-CM

## 2011-05-11 DIAGNOSIS — I509 Heart failure, unspecified: Secondary | ICD-10-CM

## 2011-05-11 DIAGNOSIS — I4891 Unspecified atrial fibrillation: Secondary | ICD-10-CM

## 2011-05-11 NOTE — Assessment & Plan Note (Addendum)
Volume status minimally elevated.  NYHA II.  She will continue current regimen but take an extra dose of lasix today.  Discussed prn use of lasix with increased SOB/fluid.

## 2011-05-11 NOTE — Progress Notes (Signed)
HPI:  [name] is a delightful 77 year old woman with a history of chronic atrial fibrillation, hypertension, hyperlipidemia, mild dementia and diabetes.   Had echo in 12/11. Normal LV function. Mild to moderate AI. BNP mildly elevated 154.  Treated for volume overload in June.  Patient diuresed well with Lasix 40 mg daily.  Her symptoms improved.    She returns for follow up today.  She denies SOB at rest.  She is able to walk ~0.5 miles around the mall before having to rest.  She denies orthopnea or PND.  She denies CP or palpitations.  Lower extremity edema is much improved.  She has not had to take any extra lasix doses.    ROS: All systems negative except as listed in HPI, PMH and Problem List.  Past Medical History  Diagnosis Date  . Chronic atrial fibrillation     echo 9/09 EF 60%, mild MR  . HTN (hypertension)   . Hyperlipidemia   . Diabetes mellitus     Current Outpatient Prescriptions  Medication Sig Dispense Refill  . acetaminophen (TYLENOL) 650 MG CR tablet Take 1,300 mg by mouth daily.        . fish oil-omega-3 fatty acids 1000 MG capsule Take 1 g by mouth daily.        . furosemide (LASIX) 40 MG tablet Take 1 tablet (40 mg total) by mouth daily.  90 tablet  3  . hydrochlorothiazide 25 MG tablet Take 25 mg by mouth daily.        Marland Kitchen losartan (COZAAR) 25 MG tablet TAKE 1 TABLET DAILY  90 tablet  2  . memantine (NAMENDA) 10 MG tablet Take 10 mg by mouth 2 (two) times daily.        . metoprolol (LOPRESSOR) 100 MG tablet Take 100 mg by mouth 2 (two) times daily.        . potassium chloride SA (K-DUR,KLOR-CON) 20 MEQ tablet Take 2 tablets (40 mEq total) by mouth daily.  180 tablet  3  . simvastatin (ZOCOR) 40 MG tablet Take 40 mg by mouth daily.        Marland Kitchen warfarin (COUMADIN) 5 MG tablet Take 5 mg by mouth daily. Use as directed by Anticoagulation clinic.          PHYSICAL EXAM: Filed Vitals:   05/11/11 1229  BP: 113/75  Pulse: 90   General:  Well appearing. No resp  difficulty HEENT: normal Neck: supple. JVP 6  Carotids 2+ bilaterally; no bruits. No lymphadenopathy or thryomegaly appreciated. Cor: PMI normal. Irregular rate & rhythm. No rubs, gallops or murmurs. Lungs: bibasilar crackles Abdomen: soft, nontender, nondistended. No hepatosplenomegaly. No bruits or masses. Good bowel sounds. Extremities: no cyanosis, clubbing, rash, trace edema.   Neuro: alert & orientedx3, cranial nerves grossly intact. Moves all 4 extremities w/o difficulty. Affect pleasant.    ECG: AF 93 +LVH. No ST-T wave abnormalities.     ASSESSMENT & PLAN:

## 2011-05-11 NOTE — Assessment & Plan Note (Addendum)
Continues to be asymptomatic.  Ventricular response mildly elevated, will continue lopressor.  She should continue coumadin.

## 2011-05-11 NOTE — Patient Instructions (Addendum)
Your physician wants you to follow-up in: 6 months  You will receive a reminder letter in the mail two months in advance. If you don't receive a letter, please call our office to schedule the follow-up appointment.  Your physician recommends that you continue on your current medications as directed. Please refer to the Current Medication list given to you today.  

## 2011-10-13 DIAGNOSIS — M159 Polyosteoarthritis, unspecified: Secondary | ICD-10-CM | POA: Diagnosis not present

## 2011-10-13 DIAGNOSIS — E119 Type 2 diabetes mellitus without complications: Secondary | ICD-10-CM | POA: Diagnosis not present

## 2011-10-13 DIAGNOSIS — Z7901 Long term (current) use of anticoagulants: Secondary | ICD-10-CM | POA: Diagnosis not present

## 2011-10-13 DIAGNOSIS — I10 Essential (primary) hypertension: Secondary | ICD-10-CM | POA: Diagnosis not present

## 2011-10-13 DIAGNOSIS — Z23 Encounter for immunization: Secondary | ICD-10-CM | POA: Diagnosis not present

## 2011-10-13 DIAGNOSIS — E7800 Pure hypercholesterolemia, unspecified: Secondary | ICD-10-CM | POA: Diagnosis not present

## 2011-10-25 ENCOUNTER — Ambulatory Visit (INDEPENDENT_AMBULATORY_CARE_PROVIDER_SITE_OTHER): Payer: Medicare Other | Admitting: Internal Medicine

## 2011-10-25 ENCOUNTER — Encounter: Payer: Self-pay | Admitting: Internal Medicine

## 2011-10-25 VITALS — BP 106/74 | HR 60 | Resp 12 | Ht 64.0 in | Wt 162.8 lb

## 2011-10-25 DIAGNOSIS — I10 Essential (primary) hypertension: Secondary | ICD-10-CM

## 2011-10-25 DIAGNOSIS — I4891 Unspecified atrial fibrillation: Secondary | ICD-10-CM

## 2011-10-25 DIAGNOSIS — R0602 Shortness of breath: Secondary | ICD-10-CM

## 2011-10-25 NOTE — Patient Instructions (Signed)
A chest x-ray takes a picture of the organs and structures inside the chest, including the heart, lungs, and blood vessels. This test can show several things, including, whether the heart is enlarges; whether fluid is building up in the lungs; and whether pacemaker / defibrillator leads are still in place.  10:30 am tomorrow at the [facility]  Your physician has recommended that you have a pulmonary function test. Pulmonary Function Tests are a group of tests that measure how well air moves in and out of your lungs.  10:30 am tomorrow at the [facility]  Your physician wants you to follow-up in: 6 months at the Heart Failure Clinic at [facility].   You will receive a reminder letter in the mail two months in advance. If you don't receive a letter, please call our office to schedule the follow-up appointment.

## 2011-10-25 NOTE — Assessment & Plan Note (Signed)
No evidence of ischemia. Continue current regimen.   

## 2011-10-25 NOTE — Progress Notes (Signed)
HPI:  [name] is a delightful 78 year old woman with a history of chronic atrial fibrillation, hypertension, hyperlipidemia, mild dementia and diabetes. She returns today for routine followup.  Had echo in 12/11. Normal LV function. Mild to moderate AI. BNP mildly elevated 154.  Feels like her dyspnea is continuing to get worse. No CP. Can barely walk to car without getting SOB. Still doing her mal walking every day. Walks 1/2 mile slowly but occasionally has to stop. Remains active but gets SOB if she tries to do anything. No palpitations. Weight down 3 pounds. LE edema improving.  No orthopnea or PND. Coumadin followed by Dr. Nedra Hai.   No wheezing or cough.    ROS: All systems negative except as listed in HPI, PMH and Problem List.  Past Medical History  Diagnosis Date  . Chronic atrial fibrillation     echo 9/09 EF 60%, mild MR  . HTN (hypertension)   . Hyperlipidemia   . Diabetes mellitus     Current Outpatient Prescriptions  Medication Sig Dispense Refill  . acetaminophen (TYLENOL) 650 MG CR tablet Take 1,300 mg by mouth daily.        . fish oil-omega-3 fatty acids 1000 MG capsule Take 1 g by mouth daily.        . furosemide (LASIX) 40 MG tablet Take 1 tablet (40 mg total) by mouth daily.  90 tablet  3  . hydrochlorothiazide 25 MG tablet Take 25 mg by mouth daily.        Marland Kitchen losartan (COZAAR) 25 MG tablet TAKE 1 TABLET DAILY  90 tablet  2  . memantine (NAMENDA) 10 MG tablet Take 10 mg by mouth 2 (two) times daily.        . metoprolol (LOPRESSOR) 100 MG tablet Take 100 mg by mouth 2 (two) times daily.        . potassium chloride SA (K-DUR,KLOR-CON) 20 MEQ tablet Take 2 tablets (40 mEq total) by mouth daily.  180 tablet  3  . simvastatin (ZOCOR) 40 MG tablet Take 40 mg by mouth daily.        Marland Kitchen warfarin (COUMADIN) 5 MG tablet Take 5 mg by mouth daily. Use as directed by Anticoagulation clinic.          PHYSICAL EXAM: Filed Vitals:   10/25/11 0946  BP: 106/74  Pulse: 60  Resp: 12    General:  Well appearing. No resp difficulty HEENT: normal Neck: supple. JVP 6-7  Carotids 2+ bilaterally; no bruits. No lymphadenopathy or thryomegaly appreciated. Cor: PMI normal. Irregular rate & rhythm. No rubs, gallops or murmurs. Lungs: faint  bibasilar crackles Abdomen: soft, nontender, nondistended. No hepatosplenomegaly. No bruits or masses. Good bowel sounds. Extremities: no cyanosis, clubbing, rash, tr-1+ edema R>L Neuro: alert & orientedx3, cranial nerves grossly intact. Moves all 4 extremities w/o difficulty. Affect pleasant.    ECG: AF 82 +LVH. Non-specific ST abnormalities. (no significant change)    ASSESSMENT & PLAN:

## 2011-10-25 NOTE — Assessment & Plan Note (Signed)
Chronic. Well rate controlled. Doing well on coumadin.

## 2011-10-25 NOTE — Assessment & Plan Note (Addendum)
Overall seems fairly stable compared to previous visits. She does have mild diastolic dysfunction but BNP only minimally elevated and symptoms seem out of proportion to that. Will check PFTs and CXR as well as ambulatory O2 sats.   Addendum: Ambulatory O2 sats normal (94-97%)> Normal HR response to hall walk.

## 2011-10-26 ENCOUNTER — Encounter: Payer: Self-pay | Admitting: Internal Medicine

## 2011-10-26 DIAGNOSIS — R0609 Other forms of dyspnea: Secondary | ICD-10-CM | POA: Diagnosis not present

## 2011-10-26 LAB — PULMONARY FUNCTION TEST

## 2011-11-02 DIAGNOSIS — M75120 Complete rotator cuff tear or rupture of unspecified shoulder, not specified as traumatic: Secondary | ICD-10-CM | POA: Diagnosis not present

## 2011-11-04 ENCOUNTER — Encounter: Payer: Self-pay | Admitting: Internal Medicine

## 2011-11-04 LAB — PULMONARY FUNCTION TEST

## 2011-11-06 ENCOUNTER — Other Ambulatory Visit (HOSPITAL_COMMUNITY): Payer: Self-pay | Admitting: Internal Medicine

## 2011-11-16 ENCOUNTER — Telehealth: Payer: Self-pay | Admitting: Internal Medicine

## 2011-11-16 DIAGNOSIS — I4891 Unspecified atrial fibrillation: Secondary | ICD-10-CM | POA: Diagnosis not present

## 2011-11-16 DIAGNOSIS — F0390 Unspecified dementia without behavioral disturbance: Secondary | ICD-10-CM | POA: Diagnosis not present

## 2011-11-16 DIAGNOSIS — G459 Transient cerebral ischemic attack, unspecified: Secondary | ICD-10-CM | POA: Diagnosis not present

## 2011-11-16 DIAGNOSIS — R413 Other amnesia: Secondary | ICD-10-CM | POA: Diagnosis not present

## 2011-11-16 NOTE — Telephone Encounter (Signed)
New msg Pt wants to know results of breathing test she had done in January.

## 2011-11-16 NOTE — Telephone Encounter (Signed)
Pt was notified that I will call her as soon as I receive the results.

## 2011-11-16 NOTE — Telephone Encounter (Signed)
Checking with Dr Bensimhon 

## 2011-11-17 DIAGNOSIS — I10 Essential (primary) hypertension: Secondary | ICD-10-CM | POA: Diagnosis not present

## 2011-11-17 DIAGNOSIS — I4891 Unspecified atrial fibrillation: Secondary | ICD-10-CM | POA: Diagnosis not present

## 2011-11-17 DIAGNOSIS — Z7901 Long term (current) use of anticoagulants: Secondary | ICD-10-CM | POA: Diagnosis not present

## 2011-11-17 DIAGNOSIS — Z6829 Body mass index (BMI) 29.0-29.9, adult: Secondary | ICD-10-CM | POA: Diagnosis not present

## 2011-11-17 DIAGNOSIS — I6789 Other cerebrovascular disease: Secondary | ICD-10-CM | POA: Diagnosis not present

## 2011-11-17 DIAGNOSIS — Z79899 Other long term (current) drug therapy: Secondary | ICD-10-CM | POA: Diagnosis not present

## 2011-11-17 NOTE — Telephone Encounter (Signed)
Note from Dr Gala Romney is as follows: "No can we call down there. Marcellus Scott, MD. [facility]."  I called them and they will fax the results to Dr Gala Romney.

## 2011-11-17 NOTE — Telephone Encounter (Signed)
Pt was updated

## 2011-11-21 ENCOUNTER — Other Ambulatory Visit (HOSPITAL_COMMUNITY): Payer: Self-pay | Admitting: Internal Medicine

## 2011-11-22 ENCOUNTER — Telehealth: Payer: Self-pay | Admitting: Internal Medicine

## 2011-11-22 NOTE — Telephone Encounter (Signed)
Pt went to Dr. Terrilee Croak office upset because we have not called them with the PFT and chest x-ray results and so Dr. Lytle Butte office would like a call back when we contact pt with the information

## 2011-11-22 NOTE — Telephone Encounter (Signed)
Results received and faxed to Dr Gala Romney at the Heart Failure clinic for interpretation.

## 2011-11-22 NOTE — Telephone Encounter (Signed)
I dont have results of CXR handy but I think it was ok. Do we have them available. PFTs look mostly like restrictive lung disease. May be worth a f/u with a pulmonologist. Would she like to see Dr. Blenda Nicely or come to see one of our guys up here?

## 2011-11-23 DIAGNOSIS — H26499 Other secondary cataract, unspecified eye: Secondary | ICD-10-CM | POA: Diagnosis not present

## 2011-11-23 DIAGNOSIS — H3530 Unspecified macular degeneration: Secondary | ICD-10-CM | POA: Diagnosis not present

## 2011-11-23 NOTE — Telephone Encounter (Signed)
[name].  LMTC at home number.  No voicemail on cell phone.

## 2011-11-23 NOTE — Telephone Encounter (Signed)
Follow up from previous call:  [place] calling returning nurse called

## 2011-11-23 NOTE — Telephone Encounter (Signed)
[name] states that her new pcp is Dr Tomasa Blase and she states he specializes in lung problems.  She is going to see him and ask if he feels she needs to see a pulmonologist or if he can handle her restrictive lung disease.

## 2011-11-29 ENCOUNTER — Telehealth: Payer: Self-pay | Admitting: Internal Medicine

## 2011-11-29 NOTE — Telephone Encounter (Signed)
Please call after 400p, pt's husband calling re results of breathing test

## 2011-11-30 NOTE — Telephone Encounter (Signed)
Dr Foye Deer in [place].  [employer].  Phone: [phone]

## 2011-11-30 NOTE — Telephone Encounter (Signed)
Mrs Fedorko is requesting a copy of the pfts be faxed to Dr Tomasa Blase.

## 2011-12-01 NOTE — Telephone Encounter (Signed)
PFT results faxed to Dr Veatrice Kells.  Pt was notified.

## 2011-12-07 DIAGNOSIS — Z6829 Body mass index (BMI) 29.0-29.9, adult: Secondary | ICD-10-CM | POA: Diagnosis not present

## 2011-12-07 DIAGNOSIS — R0682 Tachypnea, not elsewhere classified: Secondary | ICD-10-CM | POA: Diagnosis not present

## 2011-12-08 DIAGNOSIS — R911 Solitary pulmonary nodule: Secondary | ICD-10-CM | POA: Diagnosis not present

## 2011-12-08 DIAGNOSIS — R0609 Other forms of dyspnea: Secondary | ICD-10-CM | POA: Diagnosis not present

## 2011-12-08 DIAGNOSIS — N289 Disorder of kidney and ureter, unspecified: Secondary | ICD-10-CM | POA: Diagnosis not present

## 2011-12-08 DIAGNOSIS — R0989 Other specified symptoms and signs involving the circulatory and respiratory systems: Secondary | ICD-10-CM | POA: Diagnosis not present

## 2011-12-12 ENCOUNTER — Other Ambulatory Visit (HOSPITAL_COMMUNITY): Payer: Self-pay | Admitting: Internal Medicine

## 2011-12-12 DIAGNOSIS — N281 Cyst of kidney, acquired: Secondary | ICD-10-CM | POA: Diagnosis not present

## 2011-12-20 DIAGNOSIS — E785 Hyperlipidemia, unspecified: Secondary | ICD-10-CM | POA: Diagnosis not present

## 2011-12-20 DIAGNOSIS — Z79899 Other long term (current) drug therapy: Secondary | ICD-10-CM | POA: Diagnosis not present

## 2011-12-20 DIAGNOSIS — E119 Type 2 diabetes mellitus without complications: Secondary | ICD-10-CM | POA: Diagnosis not present

## 2011-12-20 DIAGNOSIS — Z7901 Long term (current) use of anticoagulants: Secondary | ICD-10-CM | POA: Diagnosis not present

## 2011-12-21 ENCOUNTER — Encounter: Payer: Self-pay | Admitting: Internal Medicine

## 2011-12-26 ENCOUNTER — Telehealth: Payer: Self-pay | Admitting: Internal Medicine

## 2011-12-26 NOTE — Telephone Encounter (Signed)
Pt's husband calling re BP check this week, pls call

## 2011-12-26 NOTE — Telephone Encounter (Signed)
Pt calling to cancel appt for bp check.  He states that Dr Tomasa Blase said her bp is good.  [name] does not know what the numbers were.

## 2012-01-20 DIAGNOSIS — Z7901 Long term (current) use of anticoagulants: Secondary | ICD-10-CM | POA: Diagnosis not present

## 2012-02-20 DIAGNOSIS — R791 Abnormal coagulation profile: Secondary | ICD-10-CM | POA: Diagnosis not present

## 2012-02-23 DIAGNOSIS — C44319 Basal cell carcinoma of skin of other parts of face: Secondary | ICD-10-CM | POA: Diagnosis not present

## 2012-02-23 DIAGNOSIS — L570 Actinic keratosis: Secondary | ICD-10-CM | POA: Diagnosis not present

## 2012-02-23 DIAGNOSIS — D1801 Hemangioma of skin and subcutaneous tissue: Secondary | ICD-10-CM | POA: Diagnosis not present

## 2012-02-23 DIAGNOSIS — L821 Other seborrheic keratosis: Secondary | ICD-10-CM | POA: Diagnosis not present

## 2012-03-05 DIAGNOSIS — Z7901 Long term (current) use of anticoagulants: Secondary | ICD-10-CM | POA: Diagnosis not present

## 2012-03-13 ENCOUNTER — Other Ambulatory Visit (HOSPITAL_COMMUNITY): Payer: Self-pay | Admitting: Internal Medicine

## 2012-04-02 ENCOUNTER — Other Ambulatory Visit (HOSPITAL_COMMUNITY): Payer: Self-pay | Admitting: Internal Medicine

## 2012-04-06 DIAGNOSIS — Z7901 Long term (current) use of anticoagulants: Secondary | ICD-10-CM | POA: Diagnosis not present

## 2012-04-23 ENCOUNTER — Other Ambulatory Visit: Payer: Self-pay | Admitting: Internal Medicine

## 2012-04-23 MED ORDER — LOSARTAN POTASSIUM 25 MG PO TABS: 25.0000 mg | ORAL_TABLET | Freq: Every day | ORAL | Status: DC

## 2012-04-23 NOTE — Telephone Encounter (Signed)
Refilled losartan and notified patient

## 2012-05-08 DIAGNOSIS — Z7901 Long term (current) use of anticoagulants: Secondary | ICD-10-CM | POA: Diagnosis not present

## 2012-06-11 DIAGNOSIS — R791 Abnormal coagulation profile: Secondary | ICD-10-CM | POA: Diagnosis not present

## 2012-06-11 DIAGNOSIS — Z6828 Body mass index (BMI) 28.0-28.9, adult: Secondary | ICD-10-CM | POA: Diagnosis not present

## 2012-06-11 DIAGNOSIS — J209 Acute bronchitis, unspecified: Secondary | ICD-10-CM | POA: Diagnosis not present

## 2012-06-22 ENCOUNTER — Other Ambulatory Visit: Payer: Self-pay | Admitting: Cardiovascular Disease

## 2012-06-22 MED ORDER — HYDROCHLOROTHIAZIDE 25 MG PO TABS: 25.0000 mg | ORAL_TABLET | Freq: Every day | ORAL | Status: DC

## 2012-06-25 DIAGNOSIS — R791 Abnormal coagulation profile: Secondary | ICD-10-CM | POA: Diagnosis not present

## 2012-07-09 DIAGNOSIS — R791 Abnormal coagulation profile: Secondary | ICD-10-CM | POA: Diagnosis not present

## 2012-07-09 DIAGNOSIS — Z23 Encounter for immunization: Secondary | ICD-10-CM | POA: Diagnosis not present

## 2012-07-23 DIAGNOSIS — R791 Abnormal coagulation profile: Secondary | ICD-10-CM | POA: Diagnosis not present

## 2012-07-26 ENCOUNTER — Other Ambulatory Visit: Payer: Self-pay | Admitting: Internal Medicine

## 2012-08-06 DIAGNOSIS — Z7901 Long term (current) use of anticoagulants: Secondary | ICD-10-CM | POA: Diagnosis not present

## 2012-08-22 ENCOUNTER — Ambulatory Visit (INDEPENDENT_AMBULATORY_CARE_PROVIDER_SITE_OTHER): Payer: Medicare Other | Admitting: Cardiovascular Disease

## 2012-08-22 ENCOUNTER — Encounter: Payer: Self-pay | Admitting: Cardiovascular Disease

## 2012-08-22 VITALS — BP 134/66 | HR 96 | Ht 64.0 in | Wt 155.0 lb

## 2012-08-22 DIAGNOSIS — I4891 Unspecified atrial fibrillation: Secondary | ICD-10-CM

## 2012-08-22 NOTE — Patient Instructions (Addendum)
Your physician wants you to follow-up in:  6 months. You will receive a reminder letter in the mail two months in advance. If you don't receive a letter, please call our office to schedule the follow-up appointment.   

## 2012-08-22 NOTE — Progress Notes (Signed)
History of Present Illness: 78 yo female with history of chronic atrial fibrillation, hypertension, hyperlipidemia, mild dementia and borderline diabetes. She returns today for routine followup. She has been followed in the past by Dr. Gala Romney and is being seen in my clinic for the first time. I see her husband also. She had echo in 12/11. Normal LV function. Mild to moderate AI. BNP mildly elevated 154. Ambulatory O2 sats normal (94-97%) in January 2013.  Normal HR response to hall walk.   She tells me that she is feeling well. No palpitations. Breathing is unchanged. She can walk 1/2 mile without stopping. No CP. Weight stable.  No orthopnea or PND. Coumadin followed by Dr. Tomasa Blase.  Primary Care Physician: Dr. Barney Drain  Last Lipid Profile: Followed in primary care.   Past Medical History  Diagnosis Date  . Chronic atrial fibrillation     echo 9/09 EF 60%, mild MR  . HTN (hypertension)   . Hyperlipidemia   . Diabetes mellitus     Past Surgical History  Procedure Date  . Appendectomy   . Shoulder surgery     Bilateral    Current Outpatient Prescriptions  Medication Sig Dispense Refill  . acetaminophen (TYLENOL) 650 MG CR tablet Take 1,300 mg by mouth daily.        . fish oil-omega-3 fatty acids 1000 MG capsule Take 1 g by mouth daily.        . furosemide (LASIX) 40 MG tablet TAKE 1 TABLET DAILY  90 tablet  2  . hydrochlorothiazide (HYDRODIURIL) 25 MG tablet Take 1 tablet (25 mg total) by mouth daily.  90 tablet  1  . losartan (COZAAR) 25 MG tablet Take 1 tablet (25 mg total) by mouth daily.  90 tablet  3  . memantine (NAMENDA) 10 MG tablet Take 10 mg by mouth 2 (two) times daily.        . metoprolol (LOPRESSOR) 100 MG tablet TAKE 1 TABLET TWICE A DAY  180 tablet  9  . potassium chloride SA (K-DUR,KLOR-CON) 20 MEQ tablet TAKE 2 TABLETS (40 MEQ TOTAL) DAILY  180 tablet  2  . simvastatin (ZOCOR) 40 MG tablet TAKE 1 TABLET ONCE DAILY  90 tablet  1  . warfarin (COUMADIN) 5 MG  tablet Take 5 mg by mouth daily. Use as directed by Anticoagulation clinic.         Allergies  Allergen Reactions  . Aspirin     History   Social History  . Marital Status: Married    Spouse Name: [name]    Number of Children: 3  . Years of Education: [name]   Occupational History  . Retired-part time in doctors offices    Social History Main Topics  . Smoking status: Former Smoker -- 0.2 packs/day for 40 years    Types: Cigarettes    Quit date: 10/03/1982  . Smokeless tobacco: Never Used  . Alcohol Use: No  . Drug Use: No  . Sexually Active: Not on file   Other Topics Concern  . Not on file   Social History Narrative   Married, exercises regularly.     Family History  Problem Relation Age of Onset  . Cancer      family history of it.   . Cancer Mother   . Cancer Father     Review of Systems:  As stated in the HPI and otherwise negative.   BP 134/66  Pulse 96  Ht 5\' 4"  (1.626 m)  Wt 155  lb (70.308 kg)  BMI 26.61 kg/m2  Physical Examination: General: Well developed, well nourished, NAD HEENT: OP clear, mucus membranes moist SKIN: warm, dry. No rashes. Neuro: No focal deficits Musculoskeletal: Muscle strength 5/5 all ext Psychiatric: Mood and affect normal Neck: No JVD, no carotid bruits, no thyromegaly, no lymphadenopathy. Lungs:Clear bilaterally, no wheezes, rhonci, crackles Cardiovascular: Regular rate and rhythm. No murmurs, gallops or rubs. Abdomen:Soft. Bowel sounds present. Non-tender.  Extremities: No lower extremity edema. Pulses are 2 + in the bilateral DP/PT.  EKG: Atrial fibrillation, rate 96 bpm.   Assessment and Plan:   1. DYSPNEA: Overall seems stable. She does have mild diastolic dysfunction. Normal LVEF by echo 2011.    2. ATRIAL FIBRILLATION: Chronic. Well rate controlled. Doing well on coumadin.   3. HYPERTENSION: BP controlled.

## 2012-09-07 DIAGNOSIS — Z7901 Long term (current) use of anticoagulants: Secondary | ICD-10-CM | POA: Diagnosis not present

## 2012-09-15 ENCOUNTER — Encounter: Payer: Self-pay | Admitting: Neurology

## 2012-10-08 DIAGNOSIS — Z7901 Long term (current) use of anticoagulants: Secondary | ICD-10-CM | POA: Diagnosis not present

## 2012-11-09 DIAGNOSIS — Z7901 Long term (current) use of anticoagulants: Secondary | ICD-10-CM | POA: Diagnosis not present

## 2012-11-29 DIAGNOSIS — H26499 Other secondary cataract, unspecified eye: Secondary | ICD-10-CM | POA: Diagnosis not present

## 2012-11-29 DIAGNOSIS — H04129 Dry eye syndrome of unspecified lacrimal gland: Secondary | ICD-10-CM | POA: Diagnosis not present

## 2012-11-29 DIAGNOSIS — H01009 Unspecified blepharitis unspecified eye, unspecified eyelid: Secondary | ICD-10-CM | POA: Diagnosis not present

## 2012-12-06 ENCOUNTER — Other Ambulatory Visit: Payer: Self-pay | Admitting: Internal Medicine

## 2012-12-11 DIAGNOSIS — Z7901 Long term (current) use of anticoagulants: Secondary | ICD-10-CM | POA: Diagnosis not present

## 2012-12-19 ENCOUNTER — Encounter: Payer: Self-pay | Admitting: *Deleted

## 2012-12-19 NOTE — Telephone Encounter (Signed)
error 

## 2012-12-27 ENCOUNTER — Other Ambulatory Visit: Payer: Self-pay | Admitting: Internal Medicine

## 2012-12-28 ENCOUNTER — Other Ambulatory Visit: Payer: Self-pay | Admitting: Cardiology

## 2012-12-28 NOTE — Telephone Encounter (Signed)
Created in error

## 2013-01-08 ENCOUNTER — Other Ambulatory Visit: Payer: Self-pay | Admitting: Neurology

## 2013-01-14 DIAGNOSIS — Z7901 Long term (current) use of anticoagulants: Secondary | ICD-10-CM | POA: Diagnosis not present

## 2013-01-29 ENCOUNTER — Other Ambulatory Visit: Payer: Self-pay | Admitting: Cardiovascular Disease

## 2013-01-29 ENCOUNTER — Other Ambulatory Visit: Payer: Self-pay | Admitting: Internal Medicine

## 2013-02-01 ENCOUNTER — Other Ambulatory Visit: Payer: Self-pay | Admitting: *Deleted

## 2013-02-01 MED ORDER — HYDROCHLOROTHIAZIDE 25 MG PO TABS: 25.0000 mg | ORAL_TABLET | Freq: Every day | ORAL | Status: DC

## 2013-02-06 ENCOUNTER — Encounter: Payer: Self-pay | Admitting: Neurology

## 2013-02-06 ENCOUNTER — Ambulatory Visit (INDEPENDENT_AMBULATORY_CARE_PROVIDER_SITE_OTHER): Payer: Medicare Other | Admitting: Neurology

## 2013-02-06 VITALS — BP 135/68 | HR 79 | Temp 98.1°F | Ht 63.0 in | Wt 163.0 lb

## 2013-02-06 DIAGNOSIS — F0390 Unspecified dementia without behavioral disturbance: Secondary | ICD-10-CM

## 2013-02-06 MED ORDER — MEMANTINE HCL 10 MG PO TABS: 10.0000 mg | ORAL_TABLET | Freq: Two times a day (BID) | ORAL | Status: DC

## 2013-02-06 MED ORDER — MEMANTINE HCL ER 28 MG PO CP24: 28.0000 mg | ORAL_CAPSULE | ORAL | Status: DC

## 2013-02-06 MED ORDER — DONEPEZIL HCL 5 MG PO TABS: 5.0000 mg | ORAL_TABLET | Freq: Every day | ORAL | Status: DC

## 2013-02-06 NOTE — Progress Notes (Signed)
[name]  Provider:  Dr [name] Referring Provider: Paulina Fusi, MD Primary Care Physician:  [name] Fusi, MD  Chief Complaint  Patient presents with  . Follow-up    memory, rm 11, MMSE:22/30,AFT:18    HPI:  [name] is a 79 y.o.  righthanded caucasian , married female  Seen here  Since 2012 as a referral from [name] for follow up on memory loss. 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   Patient reports that she never balance the checkbook because this would not be a good measure but she feels that she is independent in all activities but of daily living and began she does not drive the long and on the residential E. The patient had started with Aricept prior to her visit with me in 2013 and had significant gastrointestinal discomfort on the 5 mg pills she was therefore switched to Namenda at that time without side effects. Since her Mini-Mental status today is declined I would like for her to try the Aricept again, now that she has been primed with the Namenda which makes it easier to tolerate the drug. She continues to be physically active and walks daily, the couple got up her breakfast every morning at a local cafeteria. There is socially active and outgoing.  Review of Systems: Out of a complete 14 system review, the patient complains of only the following symptoms, and all other reviewed systems are negative. Memory   History   Social History  . Marital Status: Married    Spouse Name: [name]    Number of Children: 3  . Years of Education: [name]   Occupational History  . Retired-part time in doctors offices    Social History Main Topics  . Smoking status: Former Smoker -- 0.20 packs/day for 40 years    Types: Cigarettes    Quit date: 10/03/1982  . Smokeless tobacco: Never Used  . Alcohol Use: No  . Drug Use: No  .  Sexually Active: Not on file   Other Topics Concern  . Not on file   Social History Narrative   Married, exercises regularly. Her PCP is [name], cardiologist is [name]. She lives independently in a private home, is married.     Her husband is here with her.    The patient is 89, born with Erb's Palsy, has three adult children, two daughters and a son, 11 years younger than his sisters. The patient drinks caffeinated beverages, is a non-smoker, non-drinker, handedness: ambidexterous    Family History  Problem Relation Age of Onset  . Cancer      family history of it.   . Cancer Mother   . Cancer Father     Past Medical History  Diagnosis Date  . Chronic atrial fibrillation     echo 9/09 EF 60%, mild MR  . HTN (hypertension)   . Hyperlipidemia   . Diabetes mellitus     Past Surgical History  Procedure Laterality Date  . Appendectomy    . Shoulder surgery      Bilateral    Current Outpatient Prescriptions  Medication Sig Dispense Refill  . acetaminophen (TYLENOL) 650 MG CR tablet Take 1,300 mg by mouth daily.        . fish oil-omega-3 fatty acids 1000 MG capsule Take 1 g by mouth daily.        [name]  furosemide (LASIX) 40 MG tablet TAKE 1 TABLET DAILY  90 tablet  1  . hydrochlorothiazide (HYDRODIURIL) 25 MG tablet Take 1 tablet (25 mg total) by mouth daily.  90 tablet  3  . KLOR-CON M20 20 MEQ tablet TAKE 2 TABLETS (40 MEQ TOTAL) DAILY  180 tablet  1  . losartan (COZAAR) 25 MG tablet Take 1 tablet (25 mg total) by mouth daily.  90 tablet  3  . metoprolol (LOPRESSOR) 100 MG tablet TAKE 1 TABLET TWICE A DAY  180 tablet  9  . simvastatin (ZOCOR) 40 MG tablet TAKE 1 TABLET DAILY  90 tablet  1  . warfarin (COUMADIN) 5 MG tablet Take 5 mg by mouth daily. Use as directed by Anticoagulation clinic.       . memantine (NAMENDA) 10 MG tablet Take 1 tablet (10 mg total) by mouth 2 (two) times daily.  180 tablet  3   No current facility-administered medications for this visit.     Allergies as of 02/06/2013 - Review Complete 02/06/2013  Allergen Reaction Noted  . Aricept (donepezil hcl) Other (See Comments) 09/15/2012  . Aspirin      Vitals: BP 135/68  Pulse 79  Temp(Src) 98.1 F (36.7 C) (Oral)  Ht 5\' 3"  (1.6 m)  Wt 163 lb (73.936 kg)  BMI 28.88 kg/m2 Last Weight:  Wt Readings from Last 1 Encounters:  02/06/13 163 lb (73.936 kg)   Last Height:   Ht Readings from Last 1 Encounters:  02/06/13 5\' 3"  (1.6 m)   Vision Screening:  See vitals  Physical exam:  General: The patient is awake, alert and appears not in acute distress. The patient is well groomed. Head: Normocephalic, atraumatic. Neck is supple. Cardiovascular:  Regular rate and rhythm ,  without  murmurs or carotid bruit, and without distended neck veins. Respiratory: Lungs are clear to auscultation. Skin:  Without evidence of edema, or rash Trunk: BMI is elevated and patient  has normal posture.  Neurologic exam : The patient is awake and alert, oriented to place and time.  Memory subjective  Impaired,  patient reports that she has taken longer to remember words or names. [name] Her husband sometimes mentions names to her and she has trouble finding the right face or remembering which person carries that name. And vice versa she may see a person but cannot recall the name. She is doing well on Trail Making Test and has not gotten lost. MMSE 22 30 , and she doesn't drive alone , but with her husband as a Designer, television/film set .  There is a normal attention span & concentration ability. Speech is fluent without dysarthria, dysphonia or aphasia. Mood and affect are appropriate.  Cranial nerves: Pupils are equal and briskly reactive to light.. Extraocular movements  in vertical and horizontal planes intact and without nystagmus. Visual fields by finger perimetry are intact. Hearing to finger rub intact.  Facial sensation intact to fine touch. Facial motor strength is symmetric and tongue and uvula move  midline.  Motor exam:   Normal tone and normal muscle bulk and symmetric normal strength in lower extremities. Grip is symmetric in spite of her Erb's palsy on the right.   Sensory:  Fine touch, pinprick and vibration were tested in all extremities. Proprioception is tested in the upper extremities only. This was  normal.  Coordination: Rapid alternating movements in the fingers/hands is tested and normal. Finger-to-nose maneuver impaired on the right arm due to her Erbs palsy.  Gait and station:  Patient walks without assistive device and is able and assisted stool climb up to the exam table. Strength within normal limits. Stance is stable and normal. . Romberg testing is normal. She reported non falls.   Deep tendon reflexes: in the  upper and lower extremities are symmetric and intact.  Babinski maneuver response is downgoing.   Assessment:  After physical and neurologic examination, review of laboratory studies, imaging, neurophysiology testing and pre-existing records, assessment will be reviewed on the problem list.  Plan:  Treatment plan and additional workup will be reviewed under Problem List.   [name] is still shopping for her household and is doing well without she crochets and meds and her hobby is being glass painting she does cook for her and her husband and bakes. She does not drive alone and she does residential distances. Oh 2013 the patient scored on her M. and SE score 27 points and had an 64F T score of 13 point today at 15 months later she scored 22.0 30 but she could still spelled backwards and was able to recall one out of 3 immediate recall words. Or animal fluency test was better than last year at 16 she is fully oriented. Her EEG in 2013 was normal and a dementia laboratory panel 80 months ago was normal as well an MRI of the brain had shown chronic microvascular changes and mild volume loss no acute changes and certainly applicable to patients age 35 years of age.  She continues on Coumadin for the treatment of atrial fibrillation but Dr. Teena Dunk months congestive heart failure clinic we'll no longer follow she is mainly seen now by Dr. Mina Marble and by [name] her primary care physician at fluoroscopy.

## 2013-02-06 NOTE — Patient Instructions (Signed)
Dementia Dementia is a word that is used to describe problems with the brain and how it works. People with dementia have memory loss. They may also have problems with thinking, speaking, or solving problems. It can affect how they act around people, how they do their job, their mood, and their personality. These changes may not show up for a long time. Family or friends may not notice problems in the early part of this disease. HOME CARE The following tips are for the person living with, or caring for, the person with dementia. Make the home safe.  Remove locks on bathroom doors.  Use childproof locks on cabinets where alcohol, cleaning supplies, or chemicals are stored.  Put outlet covers in electrical outlets.  Put in childproof locks to keep doors and windows safe.  Remove stove knobs, or put in safety knobs that shut off on their own.  Lower the temperature on water heaters.  Label medicines. Lock them in a safe place.  Keep knives, lighters, matches, power tools, and guns out of reach or in a safe place.  Remove objects that might break or can hurt the person.  Make sure lighting is good inside and outside.  Put in grab bars if needed.  Use a device that detects falls or other needs for help. Lessen confusion.  Keep familiar objects and people around.  Use night lights or low lit (dim) lights at night.  Label objects or areas.  Use reminders, notes, or directions for daily activities or tasks.  Keep a simple routine that is the same for waking, meals, bathing, dressing, and bedtime.  Create a calm and quiet home.  Put up clocks and calendars.  Keep emergency numbers and the home address near all phones.  Help show the different times of day. Open the curtains during the day to let light in. Speak clearly and directly.  Choose simple words and short sentences.  Use a gentle, calm voice.  Do not interrupt.  If the person has a hard time finding a word to  use, give them the word or thought.  Ask 1 question at a time. Give enough time for the person to answer. Repeat the question if the person does not answer. Do things that lessen restlessness.  Provide a comfortable bed.  Have the same bedtime routine every night.  Have a regular walking and activity schedule.  Lessen naps during the day.  Do not let the person drink a lot of caffeine.  Go to events that are not overwhelming. Eat well and drink fluids.  Lessen distractions during meal times and snacks.  Avoid foods that are too hot or too cold.  Watch how the person chews and swallows. This is to make sure they do not choke. Other  Keep all vision, hearing, dental, and medical visits with the doctor.  Only give medicines as told by the doctor.  Watch the person's driving ability. Do not let the person drive if he or she cannot drive safely.  Use a program that helps find a person if they become missing. You may need to register with this program. GET HELP RIGHT AWAY IF:   A fever of 102 F (38.9 C) develops.  Confusion develops or gets worse.  Sleepiness develops or gets worse.  Staying awake is hard to do.  New behavior problems start like mood swings, aggression, and seeing things that are not there.  Problems with balance, speech, or falling develop.  Problems swallowing develop.  Any  problems of another sickness develop. MAKE SURE YOU:  Understand these instructions.  Will watch his or her condition.  Will get help right away if he or she is not doing well or gets worse. Document Released: 09/01/2008 Document Revised: 12/12/2011 Document Reviewed: 02/14/2011 [facility] Patient Information 2013 [place], [place].

## 2013-02-14 ENCOUNTER — Other Ambulatory Visit: Payer: Self-pay

## 2013-02-14 DIAGNOSIS — F0390 Unspecified dementia without behavioral disturbance: Secondary | ICD-10-CM

## 2013-02-14 MED ORDER — MEMANTINE HCL 10 MG PO TABS: 10.0000 mg | ORAL_TABLET | Freq: Two times a day (BID) | ORAL | Status: DC

## 2013-02-15 DIAGNOSIS — Z7901 Long term (current) use of anticoagulants: Secondary | ICD-10-CM | POA: Diagnosis not present

## 2013-02-21 ENCOUNTER — Ambulatory Visit (INDEPENDENT_AMBULATORY_CARE_PROVIDER_SITE_OTHER): Payer: Medicare Other | Admitting: Cardiovascular Disease

## 2013-02-21 ENCOUNTER — Encounter: Payer: Self-pay | Admitting: Cardiovascular Disease

## 2013-02-21 VITALS — BP 129/80 | HR 74 | Ht 64.0 in | Wt 163.0 lb

## 2013-02-21 DIAGNOSIS — I4891 Unspecified atrial fibrillation: Secondary | ICD-10-CM | POA: Diagnosis not present

## 2013-02-21 DIAGNOSIS — I351 Nonrheumatic aortic (valve) insufficiency: Secondary | ICD-10-CM

## 2013-02-21 DIAGNOSIS — I359 Nonrheumatic aortic valve disorder, unspecified: Secondary | ICD-10-CM | POA: Diagnosis not present

## 2013-02-21 NOTE — Patient Instructions (Addendum)
Your physician wants you to follow-up in:  6 months. You will receive a reminder letter in the mail two months in advance. If you don't receive a letter, please call our office to schedule the follow-up appointment.   

## 2013-02-21 NOTE — Progress Notes (Signed)
History of Present Illness: 79 yo female with history of chronic atrial fibrillation, hypertension, hyperlipidemia, mild dementia and borderline diabetes. She returns today for routine followup. She has been followed in the past by Dr. Gala Romney. I met her for the first time 08/22/12. I see her husband also. She had echo in 12/11. Normal LV function. Mild to moderate AI. BNP mildly elevated 154. Ambulatory O2 sats normal (94-97%) in January 2013. Normal HR response to hall walk.   She tells me that she is feeling well. No palpitations. Breathing is unchanged. She can walk 1/2 mile without stopping. No CP. Weight stable. No orthopnea or PND. Coumadin followed by Dr. Tomasa Blase.   Primary Care Physician: Dr. Barney Drain   Last Lipid Profile: Followed in primary care.   Past Medical History  Diagnosis Date  . Chronic atrial fibrillation     echo 9/09 EF 60%, mild MR  . HTN (hypertension)   . Hyperlipidemia   . Diabetes mellitus     Past Surgical History  Procedure Laterality Date  . Appendectomy    . Shoulder surgery      Bilateral    Current Outpatient Prescriptions  Medication Sig Dispense Refill  . acetaminophen (TYLENOL) 650 MG CR tablet Take 1,300 mg by mouth daily.        Marland Kitchen donepezil (ARICEPT) 5 MG tablet Take 1 tablet (5 mg total) by mouth at bedtime.  30 tablet  5  . fish oil-omega-3 fatty acids 1000 MG capsule Take 1 g by mouth daily.        . furosemide (LASIX) 40 MG tablet TAKE 1 TABLET DAILY  90 tablet  1  . hydrochlorothiazide (HYDRODIURIL) 25 MG tablet Take 1 tablet (25 mg total) by mouth daily.  90 tablet  3  . KLOR-CON M20 20 MEQ tablet TAKE 2 TABLETS (40 MEQ TOTAL) DAILY  180 tablet  1  . losartan (COZAAR) 25 MG tablet Take 1 tablet (25 mg total) by mouth daily.  90 tablet  3  . memantine (NAMENDA) 10 MG tablet Take 1 tablet (10 mg total) by mouth 2 (two) times daily.  180 tablet  3  . metoprolol (LOPRESSOR) 100 MG tablet TAKE 1 TABLET TWICE A DAY  180 tablet  9  .  simvastatin (ZOCOR) 40 MG tablet TAKE 1 TABLET DAILY  90 tablet  1  . warfarin (COUMADIN) 5 MG tablet Take 5 mg by mouth daily. Use as directed by Anticoagulation clinic.        No current facility-administered medications for this visit.    Allergies  Allergen Reactions  . Aricept (Donepezil Hcl) Other (See Comments)    updated from office notes, [facility](11/16/11 visit)  . Aspirin     History   Social History  . Marital Status: Married    Spouse Name: [name]    Number of Children: 3  . Years of Education: [name]   Occupational History  . Retired-part time in doctors offices    Social History Main Topics  . Smoking status: Former Smoker -- 0.20 packs/day for 40 years    Types: Cigarettes    Quit date: 10/03/1982  . Smokeless tobacco: Never Used  . Alcohol Use: No  . Drug Use: No  . Sexually Active: Not on file   Other Topics Concern  . Not on file   Social History Narrative   Married, exercises regularly. Her PCP is Dr. Nedra [name], cardiologist is Dr. Milas [name]. She lives independently in a private home, is married.  Her husband is here with her.    The patient is 22, born with Erb's Palsy, has three adult children, two daughters and a son, 70 years younger than his sisters. The patient drinks caffeinated beverages, is a non-smoker, non-drinker, handedness: ambidexterous    Family History  Problem Relation Age of Onset  . Cancer      family history of it.   . Cancer Mother   . Cancer Father     Review of Systems:  As stated in the HPI and otherwise negative.   BP 129/80  Pulse 74  Ht 5\' 4"  (1.626 m)  Wt 163 lb (73.936 kg)  BMI 27.97 kg/m2  Physical Examination: General: Well developed, well nourished, NAD HEENT: OP clear, mucus membranes moist SKIN: warm, dry. No rashes. Neuro: No focal deficits Musculoskeletal: Muscle strength 5/5 all ext Psychiatric: Mood and affect normal Neck: No JVD, no carotid bruits, no thyromegaly, no lymphadenopathy. Lungs:Clear  bilaterally, no wheezes, rhonci, crackles Cardiovascular: Irregular irregular No murmurs, gallops or rubs. Abdomen:Soft. Bowel sounds present. Non-tender.  Extremities: No lower extremity edema. Pulses are 2 + in the bilateral DP/PT.  Assessment and Plan:   1. DYSPNEA: Overall seems stable. She does have mild diastolic dysfunction. Normal LVEF by echo 2011.   2. ATRIAL FIBRILLATION: Chronic. Rate well controlled. Doing well on coumadin.   3. HYPERTENSION: BP controlled.  4. Aortic insufficiency: Mild by echo 2011. She is not a surgical candidate. Will not repeat at this time

## 2013-02-22 ENCOUNTER — Other Ambulatory Visit: Payer: Self-pay

## 2013-02-22 DIAGNOSIS — L578 Other skin changes due to chronic exposure to nonionizing radiation: Secondary | ICD-10-CM | POA: Diagnosis not present

## 2013-02-22 DIAGNOSIS — L821 Other seborrheic keratosis: Secondary | ICD-10-CM | POA: Diagnosis not present

## 2013-02-22 DIAGNOSIS — F0390 Unspecified dementia without behavioral disturbance: Secondary | ICD-10-CM

## 2013-02-22 DIAGNOSIS — L570 Actinic keratosis: Secondary | ICD-10-CM | POA: Diagnosis not present

## 2013-02-22 MED ORDER — DONEPEZIL HCL 5 MG PO TABS: 5.0000 mg | ORAL_TABLET | Freq: Every day | ORAL | Status: DC

## 2013-02-22 NOTE — Telephone Encounter (Signed)
Spouse said he has convinced the patient to keep taking Aricept.  They needed a 90 day Rx sent to [facility].

## 2013-03-11 ENCOUNTER — Other Ambulatory Visit: Payer: Self-pay | Admitting: Internal Medicine

## 2013-03-18 DIAGNOSIS — Z7901 Long term (current) use of anticoagulants: Secondary | ICD-10-CM | POA: Diagnosis not present

## 2013-03-25 ENCOUNTER — Other Ambulatory Visit: Payer: Self-pay | Admitting: Neurology

## 2013-03-30 ENCOUNTER — Other Ambulatory Visit: Payer: Self-pay | Admitting: Neurology

## 2013-04-11 DIAGNOSIS — M7730 Calcaneal spur, unspecified foot: Secondary | ICD-10-CM | POA: Diagnosis not present

## 2013-04-11 DIAGNOSIS — Z6830 Body mass index (BMI) 30.0-30.9, adult: Secondary | ICD-10-CM | POA: Diagnosis not present

## 2013-04-11 DIAGNOSIS — M79609 Pain in unspecified limb: Secondary | ICD-10-CM | POA: Diagnosis not present

## 2013-04-18 DIAGNOSIS — Z7901 Long term (current) use of anticoagulants: Secondary | ICD-10-CM | POA: Diagnosis not present

## 2013-04-29 ENCOUNTER — Other Ambulatory Visit: Payer: Self-pay | Admitting: Internal Medicine

## 2013-04-29 DIAGNOSIS — M7730 Calcaneal spur, unspecified foot: Secondary | ICD-10-CM | POA: Diagnosis not present

## 2013-04-29 DIAGNOSIS — M24576 Contracture, unspecified foot: Secondary | ICD-10-CM | POA: Diagnosis not present

## 2013-04-29 DIAGNOSIS — M722 Plantar fascial fibromatosis: Secondary | ICD-10-CM | POA: Diagnosis not present

## 2013-04-29 DIAGNOSIS — M24573 Contracture, unspecified ankle: Secondary | ICD-10-CM | POA: Diagnosis not present

## 2013-05-21 DIAGNOSIS — Z7901 Long term (current) use of anticoagulants: Secondary | ICD-10-CM | POA: Diagnosis not present

## 2013-05-31 ENCOUNTER — Telehealth: Payer: Self-pay | Admitting: Neurology

## 2013-06-05 MED ORDER — MEMANTINE HCL ER 28 MG PO CP24: 28.0000 mg | ORAL_CAPSULE | Freq: Every day | ORAL | Status: DC

## 2013-06-05 NOTE — Telephone Encounter (Signed)
Regular Namenda has been discontinued by the manufacturer.  I have updated the Rx to the new Namenda XR formulation, and sent the Rx to the pharmacy.  I called the patient back.  Explained we sent a new Rx, which should only be taken once daily.  Patient verbalized understanding.

## 2013-06-07 ENCOUNTER — Other Ambulatory Visit: Payer: Self-pay | Admitting: Cardiovascular Disease

## 2013-06-07 ENCOUNTER — Other Ambulatory Visit: Payer: Self-pay

## 2013-06-07 MED ORDER — MEMANTINE HCL ER 28 MG PO CP24: 28.0000 mg | ORAL_CAPSULE | Freq: Every day | ORAL | Status: DC

## 2013-06-26 ENCOUNTER — Encounter: Payer: Self-pay | Admitting: Cardiovascular Disease

## 2013-06-26 DIAGNOSIS — Z79899 Other long term (current) drug therapy: Secondary | ICD-10-CM | POA: Diagnosis not present

## 2013-06-26 DIAGNOSIS — Z6829 Body mass index (BMI) 29.0-29.9, adult: Secondary | ICD-10-CM | POA: Diagnosis not present

## 2013-06-26 DIAGNOSIS — Z1331 Encounter for screening for depression: Secondary | ICD-10-CM | POA: Diagnosis not present

## 2013-06-26 DIAGNOSIS — Z7901 Long term (current) use of anticoagulants: Secondary | ICD-10-CM | POA: Diagnosis not present

## 2013-06-26 DIAGNOSIS — I10 Essential (primary) hypertension: Secondary | ICD-10-CM | POA: Diagnosis not present

## 2013-06-26 DIAGNOSIS — I4891 Unspecified atrial fibrillation: Secondary | ICD-10-CM | POA: Diagnosis not present

## 2013-06-26 DIAGNOSIS — Z9181 History of falling: Secondary | ICD-10-CM | POA: Diagnosis not present

## 2013-06-26 DIAGNOSIS — E785 Hyperlipidemia, unspecified: Secondary | ICD-10-CM | POA: Diagnosis not present

## 2013-06-26 DIAGNOSIS — I6789 Other cerebrovascular disease: Secondary | ICD-10-CM | POA: Diagnosis not present

## 2013-06-26 DIAGNOSIS — E119 Type 2 diabetes mellitus without complications: Secondary | ICD-10-CM | POA: Diagnosis not present

## 2013-06-27 ENCOUNTER — Other Ambulatory Visit: Payer: Self-pay | Admitting: Internal Medicine

## 2013-07-18 ENCOUNTER — Other Ambulatory Visit: Payer: Self-pay | Admitting: Cardiovascular Disease

## 2013-07-29 DIAGNOSIS — Z7901 Long term (current) use of anticoagulants: Secondary | ICD-10-CM | POA: Diagnosis not present

## 2013-07-29 DIAGNOSIS — Z23 Encounter for immunization: Secondary | ICD-10-CM | POA: Diagnosis not present

## 2013-08-07 ENCOUNTER — Ambulatory Visit (INDEPENDENT_AMBULATORY_CARE_PROVIDER_SITE_OTHER): Payer: Medicare Other | Admitting: Neurology

## 2013-08-07 ENCOUNTER — Encounter (INDEPENDENT_AMBULATORY_CARE_PROVIDER_SITE_OTHER): Payer: Self-pay

## 2013-08-07 ENCOUNTER — Encounter: Payer: Self-pay | Admitting: Neurology

## 2013-08-07 VITALS — BP 119/73 | HR 80 | Resp 16 | Ht 63.0 in | Wt 164.0 lb

## 2013-08-07 DIAGNOSIS — R4189 Other symptoms and signs involving cognitive functions and awareness: Secondary | ICD-10-CM

## 2013-08-07 DIAGNOSIS — F09 Unspecified mental disorder due to known physiological condition: Secondary | ICD-10-CM

## 2013-08-07 HISTORY — DX: Other symptoms and signs involving cognitive functions and awareness: R41.89

## 2013-08-07 NOTE — Patient Instructions (Signed)
Driving and Equipment Restrictions °Some medical problems make it dangerous to drive, ride a bike, or use machines. Some of these problems are: °· A hard blow to the head (concussion). °· Passing out (fainting). °· Twitching and shaking (seizures). °· Low blood sugar. °· Taking medicine to help you relax (sedatives). °· Taking pain medicines. °· Wearing an eye patch. °· Wearing splints. This can make it hard to use parts of your body that you need to drive safely. °HOME CARE  °· Do not drive until your doctor says it is okay. °· Do not use machines until your doctor says it is okay. °You may need a form signed by your doctor (medical release) before you can drive again. You may also need this form before you do other tasks where you need to be fully alert. °MAKE SURE YOU: °· Understand these instructions. °· Will watch your condition. °· Will get help right away if you are not doing well or get worse. °Document Released: 10/27/2004 Document Revised: 12/12/2011 Document Reviewed: 01/27/2010 °[employer]® Patient Information ©2014 [employer]. ° °

## 2013-08-07 NOTE — Progress Notes (Signed)
[facility]  Provider:  Dr Yamel Bale Referring Provider: Paulina Fusi, MD Primary Care Physician:  Paulina Fusi, MD  Chief Complaint  Patient presents with  . Follow-up    memory, rm 11, MMSE:22/30,AFT:18    HPI:  [name] is a 79 y.o. right handed,  caucasian , married female .  Seen here since 2012 as a referral from Dr. Tomasa [name] for follow up on memory loss.  The patient was last seen in  02-06-13 . Patient reports that she never balanced the checkbooks, she feels that she is independent in all activities  of daily living and began she does not drive anymore.  The patient had started with Aricept prior to her visit with me in 2013 and had significant gastrointestinal discomfort on the 5 mg pills she was therefore switched to Namenda at that time without side effects. Since her Mini-Mental status today is declined I would like for her to try the Aricept again, now that she has been primed with the Namenda which makes it easier to tolerate the drug. She continues to be physically active and walks daily, the couple got up her breakfast every morning at a local cafeteria. She  is socially active and outgoing. She reports having trouble with names, but can place the people she meets into the right context, such as ; neighbors, church.    The patient endorsed today and a geriatric depression score 4 points. This is not suggestive of depression patient presents. She also was tested  [facility] test today- the [facility] cognitive assessment , she had difficulties with the trail making and the visual spatial problem solving for example that she was not able today to copy a cube penetrant three-dimensional image. She was able to draw clock face but placed the hands at 10 minutes before 11 . She is able to go shopping with her husband she knows where to find the item for to look for them, she has a good idea of her pantry inventory, she scored 25-30 points today on the [facility]  cognitive assessment. 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   Review of Systems: Out of a complete 14 system review, the patient complains of only the following symptoms, and all other reviewed systems are negative. Memory   History   Social History  . Marital Status: Married    Spouse Name: [name]    Number of Children: 3  . Years of Education: [name]   Occupational History  . Retired-part time in doctors offices    Social History Main Topics  . Smoking status: Former Smoker -- 0.20 packs/day for 40 years    Types: Cigarettes    Quit date: 10/03/1982  . Smokeless tobacco: Never Used  . Alcohol Use: No  . Drug Use: No  . Sexually Active: Not on file   Other Topics Concern  . Not on file   Social History Narrative   Married, exercises regularly. Her PCP is Dr. Nedra [name], cardiologist is Dr. Milas [name]. She lives independently in a private home, is married.     Her husband is here with her.    The patient is 67, born with Erb's Palsy, has three adult children, two daughters and a son, 72 years younger than his sisters. The patient drinks caffeinated beverages, is a non-smoker, non-drinker, handedness: ambidexterous    Family History  Problem Relation Age of Onset  . Cancer      family history of it.   [name]  Cancer Mother   . Cancer Father     Past Medical History  Diagnosis Date  . Chronic atrial fibrillation     echo 9/09 EF 60%, mild MR  . HTN (hypertension)   . Hyperlipidemia   . Diabetes mellitus     Past Surgical History  Procedure Laterality Date  . Appendectomy    . Shoulder surgery      Bilateral    Current Outpatient Prescriptions  Medication Sig Dispense Refill  . acetaminophen (TYLENOL) 650 MG CR tablet Take 1,300 mg by mouth daily.        . fish oil-omega-3 fatty acids 1000 MG capsule Take 1 g by mouth daily.        . furosemide (LASIX) 40 MG tablet TAKE 1 TABLET DAILY  90 tablet  1  . hydrochlorothiazide (HYDRODIURIL) 25 MG  tablet Take 1 tablet (25 mg total) by mouth daily.  90 tablet  3  . KLOR-CON M20 20 MEQ tablet TAKE 2 TABLETS (40 MEQ TOTAL) DAILY  180 tablet  1  . losartan (COZAAR) 25 MG tablet Take 1 tablet (25 mg total) by mouth daily.  90 tablet  3  . metoprolol (LOPRESSOR) 100 MG tablet TAKE 1 TABLET TWICE A DAY  180 tablet  9  . simvastatin (ZOCOR) 40 MG tablet TAKE 1 TABLET DAILY  90 tablet  1  . warfarin (COUMADIN) 5 MG tablet Take 5 mg by mouth daily. Use as directed by Anticoagulation clinic.       . memantine (NAMENDA) 10 MG tablet Take 1 tablet (10 mg total) by mouth 2 (two) times daily.  180 tablet  3   No current facility-administered medications for this visit.    Allergies as of 02/06/2013 - Review Complete 02/06/2013  Allergen Reaction Noted  . Aricept (donepezil hcl) Other (See Comments) 09/15/2012  . Aspirin      Physical exam:  General: The patient is awake, alert and appears not in acute distress. The patient is well groomed. Head: Normocephalic, atraumatic. Neck is supple. Cardiovascular:  Regular rate and rhythm ,  without  murmurs or carotid bruit, and without distended neck veins. Respiratory: Lungs are clear to auscultation. Skin:  Without evidence of edema, or rash Trunk: BMI is elevated and patient  has normal posture.  Neurologic exam : The patient is awake and alert, oriented to place and time.  Memory subjective  Impaired,  patient reports that she has taken longer to remember words or names. [name] Her husband sometimes mentions names to her and she has trouble finding the right face or remembering which person carries that name. And vice versa she may see a person but cannot recall the name. She is doing well on Trail Making Test and has not gotten lost. MMSE 22 30 , and she doesn't drive alone , but with her husband as a Designer, television/film set .  There is a normal attention span & concentration ability. Speech is fluent without dysarthria, dysphonia or aphasia. Mood and affect are  appropriate.  Cranial nerves: Pupils are equal and briskly reactive to light.. Extraocular movements  in vertical and horizontal planes intact and without nystagmus. Visual fields by finger perimetry are intact. Hearing to finger rub intact.  Facial sensation intact to fine touch. Facial motor strength is symmetric and tongue and uvula move midline.  Motor exam:   Normal tone and normal muscle bulk and symmetric normal strength in lower extremities. Grip is symmetric in spite of her Erb's palsy on the  right.   Sensory:  Fine touch, pinprick and vibration were tested in all extremities. Proprioception is tested in the upper extremities only. This was  normal.  Coordination: Rapid alternating movements in the fingers/hands is tested and normal. Finger-to-nose maneuver impaired on the right arm due to her Erbs palsy.  Gait and station: Patient walks without assistive device and is able and assisted stool climb up to the exam table. Strength within normal limits. Stance is stable and normal. . Romberg testing is normal. She reported non falls.   Deep tendon reflexes: in the  upper and lower extremities are symmetric and intact.  Babinski maneuver response is downgoing.   Assessment:  After physical and neurologic examination, review of laboratory studies, imaging: this patient has  Only  Mildly progressive memory loss, of the mild cognitive impairment type. This could reflect early Alzheimers.  Plan:     Mrs. Greenfield is still shopping  and is doing well. she crochets and  continues  glass painting , she does cook for her and her husband and bakes.  She performs daily word searches.  Keep active physically and mentally.  She does not drive alone any more, not even  residential distances. 2014 animal fluency test was better than last year at 16 points ,  she is fully oriented.  Her EEG in 2013 was normal and a dementia laboratory panel 18 months ago was normal as well an  MRI of the brain had  shown chronic microvascular changes and mild volume loss no acute changes and certainly applicable to patients age 83 years of age.   She continues on Coumadin for the treatment of atrial fibrillation but Dr. Teena Dunk months congestive heart failure clinic we'll no longer follow she is mainly seen now by Dr. Mina Marble and by Dr. Tomasa [name] her primary care physician .

## 2013-08-21 ENCOUNTER — Ambulatory Visit (INDEPENDENT_AMBULATORY_CARE_PROVIDER_SITE_OTHER): Payer: Medicare Other | Admitting: Cardiovascular Disease

## 2013-08-21 ENCOUNTER — Encounter: Payer: Self-pay | Admitting: Cardiovascular Disease

## 2013-08-21 VITALS — BP 126/87 | HR 83 | Ht 64.0 in | Wt 166.0 lb

## 2013-08-21 DIAGNOSIS — I351 Nonrheumatic aortic (valve) insufficiency: Secondary | ICD-10-CM

## 2013-08-21 DIAGNOSIS — I359 Nonrheumatic aortic valve disorder, unspecified: Secondary | ICD-10-CM

## 2013-08-21 DIAGNOSIS — I10 Essential (primary) hypertension: Secondary | ICD-10-CM | POA: Diagnosis not present

## 2013-08-21 DIAGNOSIS — I4891 Unspecified atrial fibrillation: Secondary | ICD-10-CM

## 2013-08-21 DIAGNOSIS — R0602 Shortness of breath: Secondary | ICD-10-CM

## 2013-08-21 NOTE — Progress Notes (Signed)
History of Present Illness: 79 yo female with history of chronic atrial fibrillation, hypertension, hyperlipidemia, mild dementia and borderline diabetes. She returns today for routine followup. She has been followed in the past by Dr. Gala Romney. I met her for the first time 08/22/12. I see her husband also. She had echo in 12/11. Normal LV function. Mild to moderate AI. BNP mildly elevated 154. Ambulatory O2 sats normal (94-97%) in January 2013.   She tells me that she is feeling well. No palpitations. Breathing is unchanged. She can walk 1/2 mile without stopping. No CP. Weight stable. No orthopnea or PND. Coumadin followed by Dr. Tomasa Blase.   Primary Care Physician: Dr. Barney Drain   Last Lipid Profile: Followed in primary care.   Past Medical History  Diagnosis Date  . Chronic atrial fibrillation     echo 9/09 EF 60%, mild MR  . HTN (hypertension)   . Hyperlipidemia   . Diabetes mellitus   . Cognitive deficits 08/07/2013    Past Surgical History  Procedure Laterality Date  . Appendectomy    . Shoulder surgery      Bilateral    Current Outpatient Prescriptions  Medication Sig Dispense Refill  . acetaminophen (TYLENOL) 650 MG CR tablet Take 1,300 mg by mouth daily.        Marland Kitchen donepezil (ARICEPT) 5 MG tablet TAKE 1 TABLET EVERY DAY AT NIGHT  90 tablet  1  . fish oil-omega-3 fatty acids 1000 MG capsule Take 1 g by mouth daily.        . furosemide (LASIX) 40 MG tablet TAKE 1 TABLET DAILY  90 tablet  0  . hydrochlorothiazide (HYDRODIURIL) 25 MG tablet Take 1 tablet (25 mg total) by mouth daily.  90 tablet  3  . KLOR-CON M20 20 MEQ tablet TAKE 2 TABLETS (40 MEQ TOTAL) DAILY  180 tablet  0  . losartan (COZAAR) 25 MG tablet TAKE 1 TABLET (25 MG) DAILY  90 tablet  1  . Memantine HCl ER (NAMENDA XR) 28 MG CP24 Take 28 mg by mouth daily.  90 capsule  2  . metoprolol (LOPRESSOR) 100 MG tablet TAKE 1 TABLET TWICE A DAY  180 tablet  8  . simvastatin (ZOCOR) 40 MG tablet TAKE 1 TABLET DAILY   90 tablet  0  . warfarin (COUMADIN) 5 MG tablet Take 5 mg by mouth daily. Use as directed by Anticoagulation clinic.        No current facility-administered medications for this visit.    Allergies  Allergen Reactions  . Aricept [Donepezil Hcl] Other (See Comments)    updated from office notes, [facility](11/16/11 visit)  . Aspirin     History   Social History  . Marital Status: Married    Spouse Name: [name]    Number of Children: 3  . Years of Education: [name]   Occupational History  . Retired-part time in doctors offices    Social History Main Topics  . Smoking status: Former Smoker -- 0.20 packs/day for 40 years    Types: Cigarettes    Quit date: 10/03/1982  . Smokeless tobacco: Never Used  . Alcohol Use: No  . Drug Use: No  . Sexual Activity: Not on file   Other Topics Concern  . Not on file   Social History Narrative   Married, exercises regularly. Her PCP is Dr. Nedra Hai, cardiologist is Dr. Milas Kocher. She lives independently in a private home, is married.     Her husband is here with her.  The patient is 62, born with Erb's Palsy, has three adult children, two daughters and a son, 73 years younger than his sisters. The patient drinks caffeinated beverages, is a non-smoker, non-drinker, handedness: ambidexterous    Family History  Problem Relation Age of Onset  . Cancer      family history of it.   . Cancer Mother   . Cancer Father     Review of Systems:  As stated in the HPI and otherwise negative.   BP 126/87  Pulse 83  Ht 5\' 4"  (1.626 m)  Wt 166 lb (75.297 kg)  BMI 28.48 kg/m2  Physical Examination: General: Well developed, well nourished, NAD HEENT: OP clear, mucus membranes moist SKIN: warm, dry. No rashes. Neuro: No focal deficits Musculoskeletal: Muscle strength 5/5 all ext Psychiatric: Mood and affect normal Neck: No JVD, no carotid bruits, no thyromegaly, no lymphadenopathy. Lungs:Clear bilaterally, no wheezes, rhonci, crackles Cardiovascular:  Irregular irregular No murmurs, gallops or rubs. Abdomen:Soft. Bowel sounds present. Non-tender.  Extremities: No lower extremity edema. Pulses are 2 + in the bilateral DP/PT.  EKG: Atrial fibrillation. Rate 83 bpm. Non-specific ST and T wave abnormality.   Assessment and Plan:   1. DYSPNEA: Overall seems stable. She does have mild diastolic dysfunction. Normal LVEF by echo 2011.   2. ATRIAL FIBRILLATION: Chronic. Rate well controlled. Doing well on coumadin.   3. HYPERTENSION: BP controlled.  4. Aortic insufficiency: Mild by echo 2011. She is not a surgical candidate. Will not repeat at this time

## 2013-08-21 NOTE — Patient Instructions (Signed)
Your physician wants you to follow-up in:  6 months. You will receive a reminder letter in the mail two months in advance. If you don't receive a letter, please call our office to schedule the follow-up appointment.   

## 2013-08-27 DIAGNOSIS — Z7901 Long term (current) use of anticoagulants: Secondary | ICD-10-CM | POA: Diagnosis not present

## 2013-09-06 ENCOUNTER — Other Ambulatory Visit: Payer: Self-pay | Admitting: Cardiovascular Disease

## 2013-09-27 ENCOUNTER — Encounter: Payer: Self-pay | Admitting: Cardiovascular Disease

## 2013-09-27 DIAGNOSIS — I4891 Unspecified atrial fibrillation: Secondary | ICD-10-CM | POA: Diagnosis not present

## 2013-09-27 DIAGNOSIS — Z79899 Other long term (current) drug therapy: Secondary | ICD-10-CM | POA: Diagnosis not present

## 2013-09-27 DIAGNOSIS — I10 Essential (primary) hypertension: Secondary | ICD-10-CM | POA: Diagnosis not present

## 2013-09-27 DIAGNOSIS — E785 Hyperlipidemia, unspecified: Secondary | ICD-10-CM | POA: Diagnosis not present

## 2013-09-27 DIAGNOSIS — E119 Type 2 diabetes mellitus without complications: Secondary | ICD-10-CM | POA: Diagnosis not present

## 2013-09-30 ENCOUNTER — Other Ambulatory Visit: Payer: Self-pay | Admitting: Cardiovascular Disease

## 2013-09-30 ENCOUNTER — Other Ambulatory Visit: Payer: Self-pay | Admitting: Internal Medicine

## 2013-10-09 NOTE — Telephone Encounter (Signed)
Duplicate request. Already addressed.

## 2013-10-14 ENCOUNTER — Other Ambulatory Visit: Payer: Self-pay | Admitting: Neurology

## 2013-10-16 ENCOUNTER — Other Ambulatory Visit: Payer: Self-pay

## 2013-10-16 MED ORDER — POTASSIUM CHLORIDE CRYS ER 20 MEQ PO TBCR: EXTENDED_RELEASE_TABLET | ORAL | Status: DC

## 2013-10-28 DIAGNOSIS — Z7901 Long term (current) use of anticoagulants: Secondary | ICD-10-CM | POA: Diagnosis not present

## 2013-10-30 ENCOUNTER — Other Ambulatory Visit: Payer: Self-pay | Admitting: *Deleted

## 2013-10-30 MED ORDER — SIMVASTATIN 40 MG PO TABS: ORAL_TABLET | ORAL | Status: DC

## 2013-11-27 ENCOUNTER — Other Ambulatory Visit: Payer: Self-pay

## 2013-11-27 DIAGNOSIS — Z7901 Long term (current) use of anticoagulants: Secondary | ICD-10-CM | POA: Diagnosis not present

## 2013-11-27 MED ORDER — FUROSEMIDE 40 MG PO TABS: ORAL_TABLET | ORAL | Status: DC

## 2013-11-27 MED ORDER — METOPROLOL TARTRATE 100 MG PO TABS: ORAL_TABLET | ORAL | Status: DC

## 2013-12-09 DIAGNOSIS — J209 Acute bronchitis, unspecified: Secondary | ICD-10-CM | POA: Diagnosis not present

## 2013-12-11 DIAGNOSIS — J209 Acute bronchitis, unspecified: Secondary | ICD-10-CM | POA: Diagnosis not present

## 2013-12-11 DIAGNOSIS — Z6829 Body mass index (BMI) 29.0-29.9, adult: Secondary | ICD-10-CM | POA: Diagnosis not present

## 2013-12-16 DIAGNOSIS — J209 Acute bronchitis, unspecified: Secondary | ICD-10-CM | POA: Diagnosis not present

## 2013-12-16 DIAGNOSIS — Z6829 Body mass index (BMI) 29.0-29.9, adult: Secondary | ICD-10-CM | POA: Diagnosis not present

## 2013-12-17 DIAGNOSIS — J40 Bronchitis, not specified as acute or chronic: Secondary | ICD-10-CM | POA: Diagnosis not present

## 2013-12-26 ENCOUNTER — Encounter: Payer: Self-pay | Admitting: Cardiovascular Disease

## 2013-12-26 DIAGNOSIS — E785 Hyperlipidemia, unspecified: Secondary | ICD-10-CM | POA: Diagnosis not present

## 2013-12-26 DIAGNOSIS — Z6829 Body mass index (BMI) 29.0-29.9, adult: Secondary | ICD-10-CM | POA: Diagnosis not present

## 2013-12-26 DIAGNOSIS — E119 Type 2 diabetes mellitus without complications: Secondary | ICD-10-CM | POA: Diagnosis not present

## 2013-12-26 DIAGNOSIS — I10 Essential (primary) hypertension: Secondary | ICD-10-CM | POA: Diagnosis not present

## 2013-12-26 DIAGNOSIS — Z79899 Other long term (current) drug therapy: Secondary | ICD-10-CM | POA: Diagnosis not present

## 2013-12-26 DIAGNOSIS — I4891 Unspecified atrial fibrillation: Secondary | ICD-10-CM | POA: Diagnosis not present

## 2013-12-27 DIAGNOSIS — Z7901 Long term (current) use of anticoagulants: Secondary | ICD-10-CM | POA: Diagnosis not present

## 2014-01-28 DIAGNOSIS — Z7901 Long term (current) use of anticoagulants: Secondary | ICD-10-CM | POA: Diagnosis not present

## 2014-02-10 ENCOUNTER — Other Ambulatory Visit: Payer: Self-pay | Admitting: *Deleted

## 2014-02-10 MED ORDER — LOSARTAN POTASSIUM 25 MG PO TABS: ORAL_TABLET | ORAL | Status: DC

## 2014-02-10 MED ORDER — HYDROCHLOROTHIAZIDE 25 MG PO TABS: 25.0000 mg | ORAL_TABLET | Freq: Every day | ORAL | Status: DC

## 2014-02-19 ENCOUNTER — Ambulatory Visit: Payer: Medicare Other | Admitting: Cardiovascular Disease

## 2014-02-21 ENCOUNTER — Ambulatory Visit: Payer: Medicare Other | Admitting: Cardiovascular Disease

## 2014-02-25 DIAGNOSIS — L578 Other skin changes due to chronic exposure to nonionizing radiation: Secondary | ICD-10-CM | POA: Diagnosis not present

## 2014-02-25 DIAGNOSIS — R233 Spontaneous ecchymoses: Secondary | ICD-10-CM | POA: Diagnosis not present

## 2014-02-25 DIAGNOSIS — L570 Actinic keratosis: Secondary | ICD-10-CM | POA: Diagnosis not present

## 2014-02-27 ENCOUNTER — Ambulatory Visit (INDEPENDENT_AMBULATORY_CARE_PROVIDER_SITE_OTHER): Payer: Medicare Other | Admitting: Cardiovascular Disease

## 2014-02-27 ENCOUNTER — Encounter: Payer: Self-pay | Admitting: Cardiovascular Disease

## 2014-02-27 VITALS — BP 134/72 | HR 80 | Ht 64.0 in | Wt 162.0 lb

## 2014-02-27 DIAGNOSIS — I10 Essential (primary) hypertension: Secondary | ICD-10-CM

## 2014-02-27 DIAGNOSIS — I4891 Unspecified atrial fibrillation: Secondary | ICD-10-CM

## 2014-02-27 DIAGNOSIS — I359 Nonrheumatic aortic valve disorder, unspecified: Secondary | ICD-10-CM

## 2014-02-27 DIAGNOSIS — R791 Abnormal coagulation profile: Secondary | ICD-10-CM | POA: Diagnosis not present

## 2014-02-27 DIAGNOSIS — I351 Nonrheumatic aortic (valve) insufficiency: Secondary | ICD-10-CM

## 2014-02-27 NOTE — Patient Instructions (Signed)
Your physician wants you to follow-up in:  6 months. You will receive a reminder letter in the mail two months in advance. If you don't receive a letter, please call our office to schedule the follow-up appointment.   

## 2014-02-27 NOTE — Progress Notes (Signed)
History of Present Illness: 80 yo female with history of chronic atrial fibrillation, hypertension, hyperlipidemia, mild dementia and borderline diabetes. She returns today for routine followup. She has been followed in the past by Dr. Haroldine Laws. I met her for the first time 08/22/12. I see her husband also. She had echo in 12/11. Normal LV function. Mild to moderate AI.   She tells me that she is feeling well. No palpitations. Breathing is unchanged. No chest pain. She has had a few falls where she tripped. No dizziness.   Primary Care Physician: Dr. Ilda Basset   Last Lipid Profile: Followed in primary care.   Past Medical History  Diagnosis Date  . Chronic atrial fibrillation     echo 9/09 EF 60%, mild MR  . HTN (hypertension)   . Hyperlipidemia   . Diabetes mellitus   . Cognitive deficits 08/07/2013    Past Surgical History  Procedure Laterality Date  . Appendectomy    . Shoulder surgery      Bilateral    Current Outpatient Prescriptions  Medication Sig Dispense Refill  . acetaminophen (TYLENOL) 650 MG CR tablet Take 1,300 mg by mouth daily.        Marland Kitchen donepezil (ARICEPT) 5 MG tablet TAKE 1 TABLET EVERY DAY AT NIGHT  90 tablet  1  . fish oil-omega-3 fatty acids 1000 MG capsule Take 1 g by mouth daily.        . furosemide (LASIX) 40 MG tablet TAKE 1 TABLET DAILY  90 tablet  2  . hydrochlorothiazide (HYDRODIURIL) 25 MG tablet Take 1 tablet (25 mg total) by mouth daily.  90 tablet  0  . losartan (COZAAR) 25 MG tablet TAKE 1 TABLET DAILY  90 tablet  0  . Memantine HCl ER (NAMENDA XR) 28 MG CP24 Take 28 mg by mouth daily.  90 capsule  2  . metoprolol (LOPRESSOR) 100 MG tablet TAKE 1 TABLET TWICE A DAY  180 tablet  2  . NAMENDA 10 MG tablet TAKE 1 TABLET BY MOUTH TWICE A DAY  180 tablet  1  . potassium chloride SA (KLOR-CON M20) 20 MEQ tablet TAKE 2 TABLETS (40 MEQ TOTAL) DAILY  180 tablet  3  . simvastatin (ZOCOR) 40 MG tablet TAKE 1 TABLET DAILY  90 tablet  1  . warfarin  (COUMADIN) 5 MG tablet Take 5 mg by mouth daily. Use as directed by Anticoagulation clinic.        No current facility-administered medications for this visit.    Allergies  Allergen Reactions  . Aricept [Donepezil Hcl] Other (See Comments)    updated from office notes, [facility](11/16/11 visit)  . Aspirin     History   Social History  . Marital Status: Married    Spouse Name: [name]    Number of Children: 3  . Years of Education: [name]   Occupational History  . Retired-part time in doctors offices    Social History Main Topics  . Smoking status: Former Smoker -- 0.20 packs/day for 40 years    Types: Cigarettes    Quit date: 10/03/1982  . Smokeless tobacco: Never Used  . Alcohol Use: No  . Drug Use: No  . Sexual Activity: Not on file   Other Topics Concern  . Not on file   Social History Narrative   Married, exercises regularly. Her PCP is Dr. Truman Hayward, cardiologist is Dr. Tempie Hoist. She lives independently in a private home, is married.     Her husband is here  with her.    The patient is 47, born with Erb's Palsy, has three adult children, two daughters and a son, 30 years younger than his sisters. The patient drinks caffeinated beverages, is a non-smoker, non-drinker, handedness: ambidexterous    Family History  Problem Relation Age of Onset  . Cancer      family history of it.   . Cancer Mother   . Cancer Father     Review of Systems:  As stated in the HPI and otherwise negative.   BP 134/72  Pulse 80  Ht 5' 4"  (1.626 m)  Wt 162 lb (73.483 kg)  BMI 27.79 kg/m2  Physical Examination: General: Well developed, well nourished, NAD HEENT: OP clear, mucus membranes moist SKIN: warm, dry. No rashes. Neuro: No focal deficits Musculoskeletal: Muscle strength 5/5 all ext Psychiatric: Mood and affect normal Neck: No JVD, no carotid bruits, no thyromegaly, no lymphadenopathy. Lungs:Clear bilaterally, no wheezes, rhonci, crackles Cardiovascular: Irregular irregular No murmurs,  gallops or rubs. Abdomen:Soft. Bowel sounds present. Non-tender.  Extremities: No lower extremity edema. Pulses are 2 + in the bilateral DP/PT.  Assessment and Plan:   1. ATRIAL FIBRILLATION: Chronic. Rate well controlled. Doing well on coumadin.   2. HYPERTENSION: BP controlled. No changes.   3. Aortic insufficiency: Mild by echo 2011. Will not repeat at this time

## 2014-03-13 DIAGNOSIS — Z7901 Long term (current) use of anticoagulants: Secondary | ICD-10-CM | POA: Diagnosis not present

## 2014-04-08 ENCOUNTER — Other Ambulatory Visit: Payer: Self-pay | Admitting: Neurology

## 2014-04-08 NOTE — Telephone Encounter (Signed)
Has an appt scheduled.

## 2014-04-09 ENCOUNTER — Telehealth: Payer: Self-pay | Admitting: Neurology

## 2014-04-09 NOTE — Telephone Encounter (Signed)
This Rx was already sent yesterday.  I called the patient back, she is aware.

## 2014-04-09 NOTE — Telephone Encounter (Signed)
Spouse calling for Rx renewal for donepezil (ARICEPT) 5 MG tablet.  Please call and advise.

## 2014-04-10 ENCOUNTER — Encounter: Payer: Self-pay | Admitting: Cardiovascular Disease

## 2014-04-10 DIAGNOSIS — E785 Hyperlipidemia, unspecified: Secondary | ICD-10-CM | POA: Diagnosis not present

## 2014-04-10 DIAGNOSIS — E119 Type 2 diabetes mellitus without complications: Secondary | ICD-10-CM | POA: Diagnosis not present

## 2014-04-10 DIAGNOSIS — I4891 Unspecified atrial fibrillation: Secondary | ICD-10-CM | POA: Diagnosis not present

## 2014-04-10 DIAGNOSIS — I10 Essential (primary) hypertension: Secondary | ICD-10-CM | POA: Diagnosis not present

## 2014-04-10 DIAGNOSIS — Z79899 Other long term (current) drug therapy: Secondary | ICD-10-CM | POA: Diagnosis not present

## 2014-04-10 DIAGNOSIS — Z6829 Body mass index (BMI) 29.0-29.9, adult: Secondary | ICD-10-CM | POA: Diagnosis not present

## 2014-04-11 DIAGNOSIS — M47817 Spondylosis without myelopathy or radiculopathy, lumbosacral region: Secondary | ICD-10-CM | POA: Diagnosis not present

## 2014-04-13 ENCOUNTER — Telehealth: Payer: Self-pay

## 2014-04-13 NOTE — Telephone Encounter (Signed)
[facility] sent [place] a letter saying they have approved our request for coverage on Donepezil effective until 04/07/2017 Ref Plan ID [id number] PA# [employer] [id number] SS

## 2014-04-14 DIAGNOSIS — Z7901 Long term (current) use of anticoagulants: Secondary | ICD-10-CM | POA: Diagnosis not present

## 2014-04-16 ENCOUNTER — Other Ambulatory Visit: Payer: Self-pay | Admitting: Neurology

## 2014-04-16 DIAGNOSIS — M6281 Muscle weakness (generalized): Secondary | ICD-10-CM | POA: Diagnosis not present

## 2014-04-16 DIAGNOSIS — R262 Difficulty in walking, not elsewhere classified: Secondary | ICD-10-CM | POA: Diagnosis not present

## 2014-04-16 DIAGNOSIS — M545 Low back pain, unspecified: Secondary | ICD-10-CM | POA: Diagnosis not present

## 2014-04-16 DIAGNOSIS — M549 Dorsalgia, unspecified: Secondary | ICD-10-CM | POA: Diagnosis not present

## 2014-04-17 ENCOUNTER — Telehealth: Payer: Self-pay | Admitting: Nurse Practitioner

## 2014-04-17 MED ORDER — MEMANTINE HCL ER 28 MG PO CP24: 28.0000 mg | ORAL_CAPSULE | Freq: Every day | ORAL | Status: DC

## 2014-04-17 NOTE — Telephone Encounter (Signed)
Patient requesting refill of Namenda, states she needs it by tomorrow morning.

## 2014-04-18 ENCOUNTER — Telehealth: Payer: Self-pay

## 2014-04-18 NOTE — Telephone Encounter (Signed)
[facility] has notified us they have approved our request for coverage on Namenda effective until 04/17/2017 Ref Plan ID [id number] PA# [employer] [id number] SS

## 2014-04-21 DIAGNOSIS — M549 Dorsalgia, unspecified: Secondary | ICD-10-CM | POA: Diagnosis not present

## 2014-04-21 DIAGNOSIS — M545 Low back pain, unspecified: Secondary | ICD-10-CM | POA: Diagnosis not present

## 2014-04-21 DIAGNOSIS — M6281 Muscle weakness (generalized): Secondary | ICD-10-CM | POA: Diagnosis not present

## 2014-04-21 DIAGNOSIS — R262 Difficulty in walking, not elsewhere classified: Secondary | ICD-10-CM | POA: Diagnosis not present

## 2014-04-23 DIAGNOSIS — R262 Difficulty in walking, not elsewhere classified: Secondary | ICD-10-CM | POA: Diagnosis not present

## 2014-04-23 DIAGNOSIS — M549 Dorsalgia, unspecified: Secondary | ICD-10-CM | POA: Diagnosis not present

## 2014-04-23 DIAGNOSIS — M545 Low back pain, unspecified: Secondary | ICD-10-CM | POA: Diagnosis not present

## 2014-04-23 DIAGNOSIS — M6281 Muscle weakness (generalized): Secondary | ICD-10-CM | POA: Diagnosis not present

## 2014-04-24 DIAGNOSIS — M545 Low back pain, unspecified: Secondary | ICD-10-CM | POA: Diagnosis not present

## 2014-04-24 DIAGNOSIS — M549 Dorsalgia, unspecified: Secondary | ICD-10-CM | POA: Diagnosis not present

## 2014-04-24 DIAGNOSIS — M6281 Muscle weakness (generalized): Secondary | ICD-10-CM | POA: Diagnosis not present

## 2014-04-24 DIAGNOSIS — R262 Difficulty in walking, not elsewhere classified: Secondary | ICD-10-CM | POA: Diagnosis not present

## 2014-04-28 DIAGNOSIS — M545 Low back pain, unspecified: Secondary | ICD-10-CM | POA: Diagnosis not present

## 2014-04-28 DIAGNOSIS — M549 Dorsalgia, unspecified: Secondary | ICD-10-CM | POA: Diagnosis not present

## 2014-04-28 DIAGNOSIS — R262 Difficulty in walking, not elsewhere classified: Secondary | ICD-10-CM | POA: Diagnosis not present

## 2014-04-28 DIAGNOSIS — M6281 Muscle weakness (generalized): Secondary | ICD-10-CM | POA: Diagnosis not present

## 2014-04-30 DIAGNOSIS — M545 Low back pain, unspecified: Secondary | ICD-10-CM | POA: Diagnosis not present

## 2014-04-30 DIAGNOSIS — M6281 Muscle weakness (generalized): Secondary | ICD-10-CM | POA: Diagnosis not present

## 2014-04-30 DIAGNOSIS — R262 Difficulty in walking, not elsewhere classified: Secondary | ICD-10-CM | POA: Diagnosis not present

## 2014-04-30 DIAGNOSIS — M549 Dorsalgia, unspecified: Secondary | ICD-10-CM | POA: Diagnosis not present

## 2014-05-02 ENCOUNTER — Other Ambulatory Visit: Payer: Self-pay | Admitting: Cardiovascular Disease

## 2014-05-02 DIAGNOSIS — M545 Low back pain, unspecified: Secondary | ICD-10-CM | POA: Diagnosis not present

## 2014-05-02 DIAGNOSIS — M549 Dorsalgia, unspecified: Secondary | ICD-10-CM | POA: Diagnosis not present

## 2014-05-02 DIAGNOSIS — R262 Difficulty in walking, not elsewhere classified: Secondary | ICD-10-CM | POA: Diagnosis not present

## 2014-05-02 DIAGNOSIS — M6281 Muscle weakness (generalized): Secondary | ICD-10-CM | POA: Diagnosis not present

## 2014-05-05 DIAGNOSIS — M549 Dorsalgia, unspecified: Secondary | ICD-10-CM | POA: Diagnosis not present

## 2014-05-05 DIAGNOSIS — R262 Difficulty in walking, not elsewhere classified: Secondary | ICD-10-CM | POA: Diagnosis not present

## 2014-05-05 DIAGNOSIS — M545 Low back pain, unspecified: Secondary | ICD-10-CM | POA: Diagnosis not present

## 2014-05-05 DIAGNOSIS — M6281 Muscle weakness (generalized): Secondary | ICD-10-CM | POA: Diagnosis not present

## 2014-05-08 DIAGNOSIS — M549 Dorsalgia, unspecified: Secondary | ICD-10-CM | POA: Diagnosis not present

## 2014-05-08 DIAGNOSIS — M6281 Muscle weakness (generalized): Secondary | ICD-10-CM | POA: Diagnosis not present

## 2014-05-08 DIAGNOSIS — M545 Low back pain, unspecified: Secondary | ICD-10-CM | POA: Diagnosis not present

## 2014-05-08 DIAGNOSIS — R262 Difficulty in walking, not elsewhere classified: Secondary | ICD-10-CM | POA: Diagnosis not present

## 2014-05-09 ENCOUNTER — Other Ambulatory Visit: Payer: Self-pay | Admitting: Cardiovascular Disease

## 2014-05-09 DIAGNOSIS — R262 Difficulty in walking, not elsewhere classified: Secondary | ICD-10-CM | POA: Diagnosis not present

## 2014-05-09 DIAGNOSIS — M549 Dorsalgia, unspecified: Secondary | ICD-10-CM | POA: Diagnosis not present

## 2014-05-09 DIAGNOSIS — M545 Low back pain, unspecified: Secondary | ICD-10-CM | POA: Diagnosis not present

## 2014-05-09 DIAGNOSIS — M6281 Muscle weakness (generalized): Secondary | ICD-10-CM | POA: Diagnosis not present

## 2014-05-15 DIAGNOSIS — Z7901 Long term (current) use of anticoagulants: Secondary | ICD-10-CM | POA: Diagnosis not present

## 2014-06-12 DIAGNOSIS — J019 Acute sinusitis, unspecified: Secondary | ICD-10-CM | POA: Diagnosis not present

## 2014-06-12 DIAGNOSIS — Z6830 Body mass index (BMI) 30.0-30.9, adult: Secondary | ICD-10-CM | POA: Diagnosis not present

## 2014-06-12 DIAGNOSIS — J209 Acute bronchitis, unspecified: Secondary | ICD-10-CM | POA: Diagnosis not present

## 2014-06-12 DIAGNOSIS — J309 Allergic rhinitis, unspecified: Secondary | ICD-10-CM | POA: Diagnosis not present

## 2014-06-16 DIAGNOSIS — S4980XA Other specified injuries of shoulder and upper arm, unspecified arm, initial encounter: Secondary | ICD-10-CM | POA: Diagnosis not present

## 2014-06-16 DIAGNOSIS — S42309A Unspecified fracture of shaft of humerus, unspecified arm, initial encounter for closed fracture: Secondary | ICD-10-CM | POA: Diagnosis not present

## 2014-06-16 DIAGNOSIS — M25519 Pain in unspecified shoulder: Secondary | ICD-10-CM | POA: Diagnosis not present

## 2014-06-16 DIAGNOSIS — Z6828 Body mass index (BMI) 28.0-28.9, adult: Secondary | ICD-10-CM | POA: Diagnosis not present

## 2014-06-16 DIAGNOSIS — R296 Repeated falls: Secondary | ICD-10-CM | POA: Diagnosis not present

## 2014-06-16 DIAGNOSIS — S46909A Unspecified injury of unspecified muscle, fascia and tendon at shoulder and upper arm level, unspecified arm, initial encounter: Secondary | ICD-10-CM | POA: Diagnosis not present

## 2014-06-17 DIAGNOSIS — R791 Abnormal coagulation profile: Secondary | ICD-10-CM | POA: Diagnosis not present

## 2014-06-19 DIAGNOSIS — S42213A Unspecified displaced fracture of surgical neck of unspecified humerus, initial encounter for closed fracture: Secondary | ICD-10-CM | POA: Diagnosis not present

## 2014-06-19 DIAGNOSIS — R791 Abnormal coagulation profile: Secondary | ICD-10-CM | POA: Diagnosis not present

## 2014-06-23 DIAGNOSIS — Z7901 Long term (current) use of anticoagulants: Secondary | ICD-10-CM | POA: Diagnosis not present

## 2014-06-27 DIAGNOSIS — R42 Dizziness and giddiness: Secondary | ICD-10-CM | POA: Diagnosis not present

## 2014-06-27 DIAGNOSIS — Z6828 Body mass index (BMI) 28.0-28.9, adult: Secondary | ICD-10-CM | POA: Diagnosis not present

## 2014-06-30 DIAGNOSIS — R791 Abnormal coagulation profile: Secondary | ICD-10-CM | POA: Diagnosis not present

## 2014-06-30 DIAGNOSIS — S42213A Unspecified displaced fracture of surgical neck of unspecified humerus, initial encounter for closed fracture: Secondary | ICD-10-CM | POA: Diagnosis not present

## 2014-07-08 ENCOUNTER — Telehealth: Payer: Self-pay | Admitting: *Deleted

## 2014-07-08 NOTE — Telephone Encounter (Signed)
Spoke with patient's husband and r/s appointment time on 08/08/14 to 9:30 am with NP MM.

## 2014-07-10 DIAGNOSIS — Z7901 Long term (current) use of anticoagulants: Secondary | ICD-10-CM | POA: Diagnosis not present

## 2014-07-11 ENCOUNTER — Other Ambulatory Visit: Payer: Self-pay | Admitting: Neurology

## 2014-07-14 ENCOUNTER — Encounter: Payer: Self-pay | Admitting: Cardiovascular Disease

## 2014-07-14 DIAGNOSIS — E785 Hyperlipidemia, unspecified: Secondary | ICD-10-CM | POA: Diagnosis not present

## 2014-07-14 DIAGNOSIS — Z23 Encounter for immunization: Secondary | ICD-10-CM | POA: Diagnosis not present

## 2014-07-14 DIAGNOSIS — I10 Essential (primary) hypertension: Secondary | ICD-10-CM | POA: Diagnosis not present

## 2014-07-14 DIAGNOSIS — S42211D Unspecified displaced fracture of surgical neck of right humerus, subsequent encounter for fracture with routine healing: Secondary | ICD-10-CM | POA: Diagnosis not present

## 2014-07-14 DIAGNOSIS — Z79899 Other long term (current) drug therapy: Secondary | ICD-10-CM | POA: Diagnosis not present

## 2014-07-14 DIAGNOSIS — I4891 Unspecified atrial fibrillation: Secondary | ICD-10-CM | POA: Diagnosis not present

## 2014-07-14 DIAGNOSIS — E119 Type 2 diabetes mellitus without complications: Secondary | ICD-10-CM | POA: Diagnosis not present

## 2014-07-17 DIAGNOSIS — Z7901 Long term (current) use of anticoagulants: Secondary | ICD-10-CM | POA: Diagnosis not present

## 2014-07-31 DIAGNOSIS — Z7901 Long term (current) use of anticoagulants: Secondary | ICD-10-CM | POA: Diagnosis not present

## 2014-08-07 DIAGNOSIS — R791 Abnormal coagulation profile: Secondary | ICD-10-CM | POA: Diagnosis not present

## 2014-08-08 ENCOUNTER — Encounter: Payer: Self-pay | Admitting: Adult Health

## 2014-08-08 ENCOUNTER — Ambulatory Visit (INDEPENDENT_AMBULATORY_CARE_PROVIDER_SITE_OTHER): Payer: Medicare Other | Admitting: Adult Health

## 2014-08-08 VITALS — BP 115/76 | HR 100 | Ht 63.0 in | Wt 158.0 lb

## 2014-08-08 DIAGNOSIS — R413 Other amnesia: Secondary | ICD-10-CM | POA: Diagnosis not present

## 2014-08-08 NOTE — Progress Notes (Signed)
I agree with the assessment and plan as directed by NP .The patient is known to me .   Mollyann Halbert, MD  

## 2014-08-08 NOTE — Progress Notes (Signed)
PATIENT: [name] DOB: 09-30-1925  REASON FOR VISIT: follow up HISTORY FROM: patient  HISTORY OF PRESENT ILLNESS: Ms. Cowens is a 80 year old female with a history of memory loss. She returns today for follow-up. She is currently take Namenda and Aricept. The patient states that her memory has remained about the same. Denies having to give up doing anything because of her memory. The patient continues to crotchet. She continues to cook and clean her home. She does have a license but she does not drive that often. Patient states that her and her husband go out every morning to eat breakfast.They  also have a group of friends that go over to the mall to walk. No new neurological complaints. The patient fell several months ago and broke her shoulder. She states that it has been healing on its on.   HISTORY 08/07/13 ([name]): Seen here since 2012 as a referral from Dr. Delena Bali for follow up on memory loss.  The patient was last seen in  02-06-13 .Patient reports that she never balanced the checkbooks, she feels that she is independent in all activities  of daily living and began she does not drive anymore. The patient had started with Aricept prior to her visit with me in 2013 and had significant gastrointestinal discomfort on the 5 mg pills she was therefore switched to Namenda at that time without side effects. Since her Mini-Mental status today is declined I would like for her to try the Aricept again, now that she has been primed with the Namenda which makes it easier to tolerate the drug. She continues to be physically active and walks daily, the couple got up her breakfast every morning at a local cafeteria. She  is socially active and outgoing. She reports having trouble with names, but can place the people she meets into the right context, such as ; neighbors, church. The patient endorsed today and a geriatric depression score 4 points. This is not suggestive of depression patient presents.  She also was tested  [facility], The test today- the [facility] cognitive assessment , she had difficulties with the trail making and the visual spatial problem solving for example that she was not able today to copy a cube penetrant three-dimensional image. She was able to draw clock face but placed the hands at 10 minutes before 11 .She is able to go shopping with her husband she knows where to find the item for to look for them, she has a good idea of her pantry inventory, she scored 25-30 points today on the [facility] cognitive assessment. 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  REVIEW OF SYSTEMS: Full 14 system review of systems performed and notable only for:  Constitutional: appetite change Eyes: [name] Ear/Nose/Throat: hearing loss Skin: [name]  Cardiovascular: [name]  Respiratory: [name]  Gastrointestinal: [name]  Genitourinary: [name] Hematology/Lymphatic: [name]  Endocrine: [name] Musculoskeletal:[name]  Allergy/Immunology: [name]  Neurological: memory loss Psychiatric: [name] Sleep: daytime sleepiness  ALLERGIES: Allergies  Allergen Reactions  . Aricept [Donepezil Hcl] Other (See Comments)    updated from office notes, [facility](11/16/11 visit)  . Aspirin     HOME MEDICATIONS: Outpatient Prescriptions Prior to Visit  Medication Sig Dispense Refill  . acetaminophen (TYLENOL) 650 MG CR tablet Take 1,300 mg by mouth daily.      Marland Kitchen donepezil (ARICEPT) 5 MG tablet TAKE 1 TABLET EVERY DAY AT NIGHT 90 tablet 0  . fish oil-omega-3 fatty acids 1000 MG capsule Take 1  g by mouth daily.      . furosemide (LASIX) 40 MG tablet TAKE 1 TABLET DAILY 90 tablet 2  . hydrochlorothiazide (HYDRODIURIL) 25 MG tablet TAKE 1 TABLET DAILY 90 tablet 0  . losartan (COZAAR) 25 MG tablet TAKE 1 TABLET DAILY 90 tablet 0  . Memantine HCl ER (NAMENDA XR) 28 MG CP24 Take 28 mg by mouth daily. 90 capsule 1  . metoprolol (LOPRESSOR) 100 MG tablet TAKE 1 TABLET TWICE A DAY 180 tablet 2  . potassium chloride SA (KLOR-CON  M20) 20 MEQ tablet TAKE 2 TABLETS (40 MEQ TOTAL) DAILY 180 tablet 3  . simvastatin (ZOCOR) 40 MG tablet TAKE 1 TABLET DAILY 90 tablet 0  . warfarin (COUMADIN) 5 MG tablet Take 5 mg by mouth daily. Use as directed by Anticoagulation clinic.     Marland Kitchen NAMENDA 10 MG tablet TAKE 1 TABLET BY MOUTH TWICE A DAY 180 tablet 1   No facility-administered medications prior to visit.    PAST MEDICAL HISTORY: Past Medical History  Diagnosis Date  . Chronic atrial fibrillation     echo 9/09 EF 60%, mild MR  . HTN (hypertension)   . Hyperlipidemia   . Diabetes mellitus   . Cognitive deficits 08/07/2013    PAST SURGICAL HISTORY: Past Surgical History  Procedure Laterality Date  . Appendectomy    . Shoulder surgery      Bilateral    FAMILY HISTORY: Family History  Problem Relation Age of Onset  . Cancer      family history of it.   . Cancer Mother   . Cancer Father     SOCIAL HISTORY: History   Social History  . Marital Status: Married    Spouse Name: Micheal    Number of Children: 3  . Years of Education: [name]   Occupational History  . Retired-part time in doctors offices    Social History Main Topics  . Smoking status: Former Smoker -- 0.20 packs/day for 40 years    Types: Cigarettes    Quit date: 10/03/1982  . Smokeless tobacco: Never Used  . Alcohol Use: No  . Drug Use: No  . Sexual Activity: Not on file   Other Topics Concern  . Not on file   Social History Narrative   Married, exercises regularly. Her PCP is Dr. Truman Hayward, cardiologist is Dr. Tempie Hoist. She lives independently in a private home, is married.     Her husband is here with her.    The patient is 27, born with Erb's Palsy, has three adult children, two daughters an   d a son, 90 years younger than his sisters. The patient drinks caffeinated beverages, is a non-smoker, non-drinker, handedness: ambidexterous      PHYSICAL EXAM  Filed Vitals:   08/08/14 0921  BP: 115/76  Pulse: 100  Height: 5\' 3"  (1.6 m)    Weight: 158 lb (71.668 kg)   Body mass index is 28 kg/(m^2).  Generalized: Well developed, in no acute distress   Neurological examination  Mentation: Alert. Follows all commands speech and language fluent. MMSE 23/30 Cranial nerve II-XII: Pupils were equal round reactive to light. Extraocular movements were full, visual field were full on confrontational test. Facial sensation and strength were normal.Uvula tongue midline. Head turning and shoulder shrug  were normal and symmetric. Motor: The motor testing reveals 5 over 5 strength of all 4 extremities. Good symmetric motor tone is noted throughout. Limited range of movement in the right shoulder due to the injury.  Also has limited range of motion in the left shoulder do to Erb's palsy Sensory: Sensory testing is intact to soft touch on all 4 extremities. No evidence of extinction is noted.  Coordination: Cerebellar testing reveals good finger-nose-finger (some difficulty due to limited range of motion) and heel-to-shin bilaterally.  Gait and station: Gait is normal. Tandem gait not attempted Romberg is negative. No drift is seen.  Reflexes: Deep tendon reflexes are symmetric and normal bilaterally.   DIAGNOSTIC DATA (LABS, IMAGING, TESTING) - I reviewed patient records, labs, notes, testing and imaging myself where available.  No results found for: WBC, HGB, HCT, MCV, PLT    Component Value Date/Time   NA 140 04/14/2011 1006   K 3.3* 04/14/2011 1006   CL 99 04/14/2011 1006   CO2 26 04/14/2011 1006   GLUCOSE 128* 04/14/2011 1006   BUN 23 04/14/2011 1006   CREATININE 1.4* 04/14/2011 1006   CALCIUM 8.8 04/14/2011 1006    Lab Results  Component Value Date   TSH 1.29 09/15/2010      ASSESSMENT AND PLAN 80 y.o. year old female  has a past medical history of Chronic atrial fibrillation; HTN (hypertension); Hyperlipidemia; Diabetes mellitus; and Cognitive deficits (08/07/2013). here with;  1. Memory deficit  Patient's memory has  remained stable. Her MMSE today is 23/30 her previous score was 22/30. The patient remains active. Her husband have a group of friends that they should socialize with every morning. The patient continues to take Aricept and Namenda and is tolerating it well. The patient should continue taking these medications. If her symptoms worsen or she develops new symptoms she should let us know. Otherwise she will follow up in 6 months or sooner if needed   Ward Givens, MSN, NP-C 08/08/2014, 9:38 AM [facility] [address] [place], [place] [address] [phone]  Note: This document was prepared with digital dictation and possible smart phrase technology. Any transcriptional errors that result from this process are unintentional.

## 2014-08-08 NOTE — Patient Instructions (Signed)

## 2014-08-09 ENCOUNTER — Other Ambulatory Visit: Payer: Self-pay | Admitting: Cardiovascular Disease

## 2014-08-11 ENCOUNTER — Other Ambulatory Visit: Payer: Self-pay

## 2014-08-11 MED ORDER — SIMVASTATIN 40 MG PO TABS: ORAL_TABLET | ORAL | Status: DC

## 2014-08-14 ENCOUNTER — Other Ambulatory Visit: Payer: Self-pay | Admitting: Cardiovascular Disease

## 2014-08-14 DIAGNOSIS — R791 Abnormal coagulation profile: Secondary | ICD-10-CM | POA: Diagnosis not present

## 2014-08-18 ENCOUNTER — Other Ambulatory Visit: Payer: Self-pay | Admitting: Cardiovascular Disease

## 2014-08-18 DIAGNOSIS — S42211D Unspecified displaced fracture of surgical neck of right humerus, subsequent encounter for fracture with routine healing: Secondary | ICD-10-CM | POA: Diagnosis not present

## 2014-08-20 ENCOUNTER — Encounter: Payer: Self-pay | Admitting: Neurology

## 2014-08-25 DIAGNOSIS — R791 Abnormal coagulation profile: Secondary | ICD-10-CM | POA: Diagnosis not present

## 2014-08-26 ENCOUNTER — Encounter: Payer: Self-pay | Admitting: Neurology

## 2014-09-01 ENCOUNTER — Encounter: Payer: Self-pay | Admitting: Cardiovascular Disease

## 2014-09-01 ENCOUNTER — Ambulatory Visit (INDEPENDENT_AMBULATORY_CARE_PROVIDER_SITE_OTHER): Payer: Medicare Other | Admitting: Cardiovascular Disease

## 2014-09-01 VITALS — BP 100/70 | HR 76 | Ht 63.0 in | Wt 156.1 lb

## 2014-09-01 DIAGNOSIS — I10 Essential (primary) hypertension: Secondary | ICD-10-CM

## 2014-09-01 DIAGNOSIS — I351 Nonrheumatic aortic (valve) insufficiency: Secondary | ICD-10-CM

## 2014-09-01 DIAGNOSIS — I481 Persistent atrial fibrillation: Secondary | ICD-10-CM

## 2014-09-01 DIAGNOSIS — I4819 Other persistent atrial fibrillation: Secondary | ICD-10-CM

## 2014-09-01 NOTE — Patient Instructions (Signed)
Your physician wants you to follow-up in:  6 months. You will receive a reminder letter in the mail two months in advance. If you don't receive a letter, please call our office to schedule the follow-up appointment.   

## 2014-09-01 NOTE — Progress Notes (Signed)
History of Present Illness: 80 yo female with history of chronic atrial fibrillation, hypertension, hyperlipidemia, mild dementia and borderline diabetes here today for cardiac follow-up. She has been followed in the past by Dr. Haroldine Laws. I met her for the first time 08/22/12. I see her husband also. She had an echo in December 2011 with normal LV function. Mild to moderate AI.   She tells me that she is feeling well. No palpitations. Breathing is unchanged. No chest pain. She has had a few falls where she tripped. No dizziness.   Primary Care Physician: Dr. Ilda Basset   Last Lipid Profile: Followed in primary care.   Past Medical History  Diagnosis Date  . Chronic atrial fibrillation     echo 9/09 EF 60%, mild MR  . HTN (hypertension)   . Hyperlipidemia   . Diabetes mellitus   . Cognitive deficits 08/07/2013    Past Surgical History  Procedure Laterality Date  . Appendectomy    . Shoulder surgery      Bilateral    Current Outpatient Prescriptions  Medication Sig Dispense Refill  . acetaminophen (TYLENOL) 650 MG CR tablet Take 1,300 mg by mouth daily.      Marland Kitchen donepezil (ARICEPT) 5 MG tablet TAKE 1 TABLET EVERY DAY AT NIGHT 90 tablet 0  . fish oil-omega-3 fatty acids 1000 MG capsule Take 1 g by mouth daily.      . furosemide (LASIX) 40 MG tablet TAKE 1 TABLET DAILY 90 tablet 2  . hydrochlorothiazide (HYDRODIURIL) 25 MG tablet TAKE 1 TABLET DAILY 90 tablet 0  . losartan (COZAAR) 25 MG tablet TAKE 1 TABLET DAILY 90 tablet 0  . Memantine HCl ER (NAMENDA XR) 28 MG CP24 Take 28 mg by mouth daily. 90 capsule 1  . metoprolol (LOPRESSOR) 100 MG tablet TAKE 1 TABLET TWICE A DAY 180 tablet 2  . potassium chloride SA (KLOR-CON M20) 20 MEQ tablet TAKE 2 TABLETS (40 MEQ TOTAL) DAILY 180 tablet 3  . simvastatin (ZOCOR) 40 MG tablet TAKE 1 TABLET DAILY 90 tablet 3  . warfarin (COUMADIN) 5 MG tablet Take 5 mg by mouth daily. Use as directed by Anticoagulation clinic.      No current  facility-administered medications for this visit.    Allergies  Allergen Reactions  . Aricept [Donepezil Hcl] Other (See Comments)    updated from office notes, [facility](11/16/11 visit)  . Aspirin     History   Social History  . Marital Status: Married    Spouse Name: Micheal    Number of Children: 3  . Years of Education: [name]   Occupational History  . Retired-part time in doctors offices    Social History Main Topics  . Smoking status: Former Smoker -- 0.20 packs/day for 40 years    Types: Cigarettes    Quit date: 10/03/1982  . Smokeless tobacco: Never Used  . Alcohol Use: No  . Drug Use: No  . Sexual Activity: Not on file   Other Topics Concern  . Not on file   Social History Narrative   Married, exercises regularly. Her PCP is Dr. Truman Hayward, cardiologist is Dr. Tempie Hoist. She lives independently in a private home, is married.     Her husband is here with her.    The patient is 70, born with Erb's Palsy, has three adult children, two daughters an   d a son, 71 years younger than his sisters. The patient drinks caffeinated beverages, is a non-smoker, non-drinker, handedness: ambidexterous  Family History  Problem Relation Age of Onset  . Cancer      family history of it.   . Cancer Mother   . Cancer Father     Review of Systems:  As stated in the HPI and otherwise negative.   BP 100/70 mmHg  Pulse 76  Ht 5' 3"  (1.6 m)  Wt 156 lb 1.9 oz (70.816 kg)  BMI 27.66 kg/m2  Physical Examination: General: Well developed, well nourished, NAD HEENT: OP clear, mucus membranes moist SKIN: warm, dry. No rashes. Neuro: No focal deficits Musculoskeletal: Muscle strength 5/5 all ext Psychiatric: Mood and affect normal Neck: No JVD, no carotid bruits, no thyromegaly, no lymphadenopathy. Lungs:Clear bilaterally, no wheezes, rhonci, crackles Cardiovascular: Irregular irregular No murmurs, gallops or rubs. Abdomen:Soft. Bowel sounds present. Non-tender.  Extremities: No lower  extremity edema. Pulses are 2 + in the bilateral DP/PT.  Assessment and Plan:   1. ATRIAL FIBRILLATION: Chronic. Rate well controlled. Doing well on coumadin.   2. HYPERTENSION: BP controlled. No changes.   3. Aortic insufficiency: Mild by echo 2011. Will not repeat at this time

## 2014-09-02 DIAGNOSIS — R791 Abnormal coagulation profile: Secondary | ICD-10-CM | POA: Diagnosis not present

## 2014-09-03 ENCOUNTER — Other Ambulatory Visit: Payer: Self-pay | Admitting: Cardiovascular Disease

## 2014-09-05 DIAGNOSIS — J209 Acute bronchitis, unspecified: Secondary | ICD-10-CM | POA: Diagnosis not present

## 2014-09-05 DIAGNOSIS — Z6828 Body mass index (BMI) 28.0-28.9, adult: Secondary | ICD-10-CM | POA: Diagnosis not present

## 2014-09-11 DIAGNOSIS — Z6828 Body mass index (BMI) 28.0-28.9, adult: Secondary | ICD-10-CM | POA: Diagnosis not present

## 2014-09-11 DIAGNOSIS — J209 Acute bronchitis, unspecified: Secondary | ICD-10-CM | POA: Diagnosis not present

## 2014-09-12 DIAGNOSIS — R791 Abnormal coagulation profile: Secondary | ICD-10-CM | POA: Diagnosis not present

## 2014-09-19 DIAGNOSIS — R791 Abnormal coagulation profile: Secondary | ICD-10-CM | POA: Diagnosis not present

## 2014-09-25 DIAGNOSIS — R791 Abnormal coagulation profile: Secondary | ICD-10-CM | POA: Diagnosis not present

## 2014-09-29 ENCOUNTER — Other Ambulatory Visit: Payer: Self-pay | Admitting: Cardiovascular Disease

## 2014-09-30 ENCOUNTER — Other Ambulatory Visit: Payer: Self-pay | Admitting: Cardiovascular Disease

## 2014-09-30 NOTE — Telephone Encounter (Signed)
[name] Blanks, MD at 09/01/2014 6:59 AM   furosemide (LASIX) 40 MG tablet TAKE 1 TABLET DAILY  Patient Instructions   Your physician wants you to follow-up in: 6 months. You will receive a reminder letter in the mail two months in advance. If you don't receive a letter, please call our office to schedule the follow-up appointment.

## 2014-10-09 DIAGNOSIS — R791 Abnormal coagulation profile: Secondary | ICD-10-CM | POA: Diagnosis not present

## 2014-10-13 ENCOUNTER — Other Ambulatory Visit: Payer: Self-pay | Admitting: Neurology

## 2014-10-17 ENCOUNTER — Encounter: Payer: Self-pay | Admitting: Cardiovascular Disease

## 2014-10-17 DIAGNOSIS — E785 Hyperlipidemia, unspecified: Secondary | ICD-10-CM | POA: Diagnosis not present

## 2014-10-17 DIAGNOSIS — I4891 Unspecified atrial fibrillation: Secondary | ICD-10-CM | POA: Diagnosis not present

## 2014-10-17 DIAGNOSIS — R791 Abnormal coagulation profile: Secondary | ICD-10-CM | POA: Diagnosis not present

## 2014-10-17 DIAGNOSIS — Z6828 Body mass index (BMI) 28.0-28.9, adult: Secondary | ICD-10-CM | POA: Diagnosis not present

## 2014-10-17 DIAGNOSIS — Z79899 Other long term (current) drug therapy: Secondary | ICD-10-CM | POA: Diagnosis not present

## 2014-10-17 DIAGNOSIS — I10 Essential (primary) hypertension: Secondary | ICD-10-CM | POA: Diagnosis not present

## 2014-10-17 DIAGNOSIS — E119 Type 2 diabetes mellitus without complications: Secondary | ICD-10-CM | POA: Diagnosis not present

## 2014-10-20 ENCOUNTER — Encounter: Payer: Self-pay | Admitting: Cardiovascular Disease

## 2014-10-20 DIAGNOSIS — E119 Type 2 diabetes mellitus without complications: Secondary | ICD-10-CM | POA: Diagnosis not present

## 2014-10-31 DIAGNOSIS — R791 Abnormal coagulation profile: Secondary | ICD-10-CM | POA: Diagnosis not present

## 2014-11-05 DIAGNOSIS — E119 Type 2 diabetes mellitus without complications: Secondary | ICD-10-CM | POA: Diagnosis not present

## 2014-11-10 ENCOUNTER — Other Ambulatory Visit: Payer: Self-pay | Admitting: Cardiovascular Disease

## 2014-11-13 ENCOUNTER — Other Ambulatory Visit: Payer: Self-pay | Admitting: Cardiovascular Disease

## 2014-12-01 DIAGNOSIS — Z7901 Long term (current) use of anticoagulants: Secondary | ICD-10-CM | POA: Diagnosis not present

## 2015-01-01 DIAGNOSIS — Z6828 Body mass index (BMI) 28.0-28.9, adult: Secondary | ICD-10-CM | POA: Diagnosis not present

## 2015-01-01 DIAGNOSIS — Z1389 Encounter for screening for other disorder: Secondary | ICD-10-CM | POA: Diagnosis not present

## 2015-01-01 DIAGNOSIS — M479 Spondylosis, unspecified: Secondary | ICD-10-CM | POA: Diagnosis not present

## 2015-01-01 DIAGNOSIS — M25551 Pain in right hip: Secondary | ICD-10-CM | POA: Diagnosis not present

## 2015-01-01 DIAGNOSIS — Z7901 Long term (current) use of anticoagulants: Secondary | ICD-10-CM | POA: Diagnosis not present

## 2015-01-01 DIAGNOSIS — Z9181 History of falling: Secondary | ICD-10-CM | POA: Diagnosis not present

## 2015-01-05 ENCOUNTER — Other Ambulatory Visit: Payer: Self-pay | Admitting: Neurology

## 2015-01-05 DIAGNOSIS — M545 Low back pain: Secondary | ICD-10-CM | POA: Diagnosis not present

## 2015-01-08 DIAGNOSIS — M545 Low back pain: Secondary | ICD-10-CM | POA: Diagnosis not present

## 2015-01-08 DIAGNOSIS — M4806 Spinal stenosis, lumbar region: Secondary | ICD-10-CM | POA: Diagnosis not present

## 2015-01-09 DIAGNOSIS — M545 Low back pain: Secondary | ICD-10-CM | POA: Diagnosis not present

## 2015-01-14 ENCOUNTER — Other Ambulatory Visit: Payer: Self-pay | Admitting: Adult Health

## 2015-01-16 DIAGNOSIS — I10 Essential (primary) hypertension: Secondary | ICD-10-CM | POA: Diagnosis not present

## 2015-01-16 DIAGNOSIS — Z23 Encounter for immunization: Secondary | ICD-10-CM | POA: Diagnosis not present

## 2015-01-16 DIAGNOSIS — M4316 Spondylolisthesis, lumbar region: Secondary | ICD-10-CM | POA: Diagnosis not present

## 2015-01-16 DIAGNOSIS — E785 Hyperlipidemia, unspecified: Secondary | ICD-10-CM | POA: Diagnosis not present

## 2015-01-16 DIAGNOSIS — E119 Type 2 diabetes mellitus without complications: Secondary | ICD-10-CM | POA: Diagnosis not present

## 2015-01-16 DIAGNOSIS — I4891 Unspecified atrial fibrillation: Secondary | ICD-10-CM | POA: Diagnosis not present

## 2015-01-16 DIAGNOSIS — Z6829 Body mass index (BMI) 29.0-29.9, adult: Secondary | ICD-10-CM | POA: Diagnosis not present

## 2015-01-16 DIAGNOSIS — Z79899 Other long term (current) drug therapy: Secondary | ICD-10-CM | POA: Diagnosis not present

## 2015-01-30 DIAGNOSIS — M4316 Spondylolisthesis, lumbar region: Secondary | ICD-10-CM | POA: Diagnosis not present

## 2015-01-30 DIAGNOSIS — M4806 Spinal stenosis, lumbar region: Secondary | ICD-10-CM | POA: Diagnosis not present

## 2015-02-02 DIAGNOSIS — Z7901 Long term (current) use of anticoagulants: Secondary | ICD-10-CM | POA: Diagnosis not present

## 2015-02-02 DIAGNOSIS — E785 Hyperlipidemia, unspecified: Secondary | ICD-10-CM | POA: Diagnosis not present

## 2015-02-04 ENCOUNTER — Encounter: Payer: Self-pay | Admitting: Cardiovascular Disease

## 2015-02-16 ENCOUNTER — Other Ambulatory Visit: Payer: Self-pay | Admitting: Cardiovascular Disease

## 2015-03-06 DIAGNOSIS — Z7901 Long term (current) use of anticoagulants: Secondary | ICD-10-CM | POA: Diagnosis not present

## 2015-03-13 DIAGNOSIS — R791 Abnormal coagulation profile: Secondary | ICD-10-CM | POA: Diagnosis not present

## 2015-03-18 DIAGNOSIS — J208 Acute bronchitis due to other specified organisms: Secondary | ICD-10-CM | POA: Diagnosis not present

## 2015-03-18 DIAGNOSIS — Z6828 Body mass index (BMI) 28.0-28.9, adult: Secondary | ICD-10-CM | POA: Diagnosis not present

## 2015-03-18 DIAGNOSIS — Z1389 Encounter for screening for other disorder: Secondary | ICD-10-CM | POA: Diagnosis not present

## 2015-03-24 ENCOUNTER — Ambulatory Visit (INDEPENDENT_AMBULATORY_CARE_PROVIDER_SITE_OTHER): Payer: Medicare Other | Admitting: Adult Health

## 2015-03-24 ENCOUNTER — Encounter: Payer: Self-pay | Admitting: Adult Health

## 2015-03-24 VITALS — BP 121/78 | HR 72 | Ht 63.0 in | Wt 157.0 lb

## 2015-03-24 DIAGNOSIS — R413 Other amnesia: Secondary | ICD-10-CM

## 2015-03-24 NOTE — Progress Notes (Signed)
PATIENT: [name] DOB: [birth year]  REASON FOR VISIT: follow up- memory loss HISTORY FROM: patient  HISTORY OF PRESENT ILLNESS: [name] is an 81 year old female with a history of memory loss. She returns today for follow-up. She is currently taking Namenda and Aricept and tolerating it well. The patient denies having to give up anything due to her memory. She feels that her memory has remained the same. She is able to complete all ADLs independently. Patient states that she is able to drive but typically lets her husband. The patient remains very social. Her and her husband go out every morning for breakfast with friends. She denies any new medical issues. Returns today for evaluation  HISTORY 08/08/14: [name] is a 81 year old female with a history of memory loss. She returns today for follow-up. She is currently take Namenda and Aricept. The patient states that her memory has remained about the same. Denies having to give up doing anything because of her memory. The patient continues to crotchet. She continues to cook and clean her home. She does have a license but she does not drive that often. Patient states that her and her husband go out every morning to eat breakfast.They also have a group of friends that go over to the mall to walk. No new neurological complaints. The patient fell several months ago and broke her shoulder. She states that it has been healing on its on.   HISTORY 08/07/13 ([name]): Seen here since 2012 as a referral from Dr. Delena Bali for follow up on memory loss. The patient was last seen in 02-06-13 .Patient reports that she never balanced the checkbooks, she feels that she is independent in all activities of daily living and began she does not drive anymore.The patient had started with Aricept prior to her visit with me in 2013 and had significant gastrointestinal discomfort on the 5 mg pills she was therefore switched to Namenda at that time without side  effects. Since her Mini-Mental status today is declined I would like for her to try the Aricept again, now that she has been primed with the Namenda which makes it easier to tolerate the drug. She continues to be physically active and walks daily, the couple got up her breakfast every morning at a local cafeteria. She is socially active and outgoing. She reports having trouble with names, but can place the people she meets into the right context, such as ; neighbors, church. The patient endorsed today and a geriatric depression score 4 points. This is not suggestive of depression patient presents. She also was tested [facility] test today- the [facility] cognitive assessment , she had difficulties with the trail making and the visual spatial problem solving for example that she was not able today to copy a cube penetrant three-dimensional image. She was able to draw clock face but placed the hands at 10 minutes before 11 .She is able to go shopping with her husband she knows where to find the item for to look for them, she has a good idea of her pantry inventory, she scored 25-30 points today on the [facility] cognitive assessment. 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   REVIEW OF SYSTEMS: Out of a complete 14 system review of symptoms, the patient complains only of the following symptoms, and all other reviewed systems are negative.  Hearing loss, activity change, fatigue  ALLERGIES: Allergies  Allergen Reactions  . Aspirin     HOME  MEDICATIONS: Outpatient Prescriptions Prior to Visit  Medication Sig Dispense Refill  . acetaminophen (TYLENOL) 650 MG CR tablet Take 1,300 mg by mouth daily.      Marland Kitchen donepezil (ARICEPT) 5 MG tablet TAKE 1 TABLET EVERY DAY AT NIGHT 90 tablet 0  . fish oil-omega-3 fatty acids 1000 MG capsule Take 1 g by mouth daily.      . furosemide (LASIX) 40 MG tablet TAKE 1 TABLET BY MOUTH EVERY DAY 90 tablet 2  . hydrochlorothiazide (HYDRODIURIL) 25 MG  tablet TAKE 1 TABLET DAILY 90 tablet 3  . KLOR-CON M20 20 MEQ tablet TAKE 2 TABLETS BY MOUTH EVERY DAY 180 tablet 2  . losartan (COZAAR) 25 MG tablet TAKE 1 TABLET DAILY 90 tablet 2  . metoprolol (LOPRESSOR) 100 MG tablet TAKE 1 TABLET TWICE A DAY 180 tablet 2  . NAMENDA XR 28 MG CP24 24 hr capsule TAKE 1 CAPSULE BY MOUTH EVERY DAY 90 capsule 0  . simvastatin (ZOCOR) 40 MG tablet TAKE 1 TABLET DAILY 90 tablet 3  . warfarin (COUMADIN) 5 MG tablet Take 5 mg by mouth daily. Use as directed by Anticoagulation clinic.      No facility-administered medications prior to visit.    PAST MEDICAL HISTORY: Past Medical History  Diagnosis Date  . Chronic atrial fibrillation     echo 9/09 EF 60%, mild MR  . HTN (hypertension)   . Hyperlipidemia   . Diabetes mellitus   . Cognitive deficits 08/07/2013    PAST SURGICAL HISTORY: Past Surgical History  Procedure Laterality Date  . Appendectomy    . Shoulder surgery      Bilateral    FAMILY HISTORY: Family History  Problem Relation Age of Onset  . Cancer      family history of it.   . Cancer Mother   . Cancer Father     SOCIAL HISTORY: History   Social History  . Marital Status: Married    Spouse Name: Micheal  . Number of Children: 3  . Years of Education: [name]   Occupational History  . Retired-part time in doctors offices    Social History Main Topics  . Smoking status: Former Smoker -- 0.20 packs/day for 40 years    Types: Cigarettes    Quit date: 10/03/1982  . Smokeless tobacco: Never Used  . Alcohol Use: No  . Drug Use: No  . Sexual Activity: Not on file   Other Topics Concern  . Not on file   Social History Narrative   Married, exercises regularly. Her PCP is Dr. Truman Hayward, cardiologist is Dr. Tempie Hoist. She lives independently in a private home, is married.     Her husband is here with her.    The patient is 36, born with Erb's Palsy, has three adult children, two daughters an   d a son, 66 years younger than his sisters.  The patient drinks caffeinated beverages, is a non-smoker, non-drinker, handedness: ambidexterous      PHYSICAL EXAM  Filed Vitals:   03/24/15 1010  BP: 121/78  Pulse: 72  Height: 5\' 3"  (1.6 m)  Weight: 157 lb (71.215 kg)   Body mass index is 27.82 kg/(m^2).  Generalized: Well developed, in no acute distress   Neurological examination  Mentation: Alert. Follows all commands speech and language fluent. MMSE 23/30  Cranial nerve II-XII: Pupils were equal round reactive to light. Extraocular movements were full, visual field were full on confrontational test. Facial sensation and strength were normal. Uvula tongue midline. Head  turning and shoulder shrug  were normal and symmetric. Motor: The motor testing reveals 5 over 5 strength of all 4 extremities. Restricted mobility in the right arm due to previous shoulder injury. Good symmetric motor tone is noted throughout.  Sensory: Sensory testing is intact to soft touch on all 4 extremities. No evidence of extinction is noted.  Coordination: Cerebellar testing reveals good finger-nose-finger- with some difficulty on the right due to previous shoulder injury. Good heel-to-shin bilaterally.  Gait and station: Gait is normal. Tandem gait is unsteady. Romberg is negative. No drift is seen.  Reflexes: Deep tendon reflexes are symmetric and normal bilaterally.    DIAGNOSTIC DATA (LABS, IMAGING, TESTING) - I reviewed patient records, labs, notes, testing and imaging myself where available.      ASSESSMENT AND PLAN 81 y.o. year old female  has a past medical history of Chronic atrial fibrillation; HTN (hypertension); Hyperlipidemia; Diabetes mellitus; and Cognitive deficits (08/07/2013). here with:  1. Memory loss  Overall the patient is doing well. Her MMSE today is 23/30 was previously 23/30. She will continue taking Aricept and Namenda. Patient advised that if her symptoms worsen or she develops new symptoms she she'll let us know.  Otherwise she'll follow-up in 6 months with Dr. Brett Fairy or sooner if needed.   Ward Givens, MSN, NP-C 03/24/2015, 10:17 AM [facility] [address] [place], [place] [address] [phone]  Note: This document was prepared with digital dictation and possible smart phrase technology. Any transcriptional errors that result from this process are unintentional.

## 2015-03-24 NOTE — Patient Instructions (Signed)
Memory has remained stable.  Continue Aricept and namenda. If symptoms worsen please let us know.

## 2015-03-27 ENCOUNTER — Encounter: Payer: Self-pay | Admitting: Cardiovascular Disease

## 2015-03-27 ENCOUNTER — Ambulatory Visit (INDEPENDENT_AMBULATORY_CARE_PROVIDER_SITE_OTHER): Payer: Medicare Other | Admitting: Cardiovascular Disease

## 2015-03-27 VITALS — BP 128/76 | HR 66 | Ht 63.0 in | Wt 158.0 lb

## 2015-03-27 DIAGNOSIS — I4819 Other persistent atrial fibrillation: Secondary | ICD-10-CM

## 2015-03-27 DIAGNOSIS — I481 Persistent atrial fibrillation: Secondary | ICD-10-CM

## 2015-03-27 DIAGNOSIS — I10 Essential (primary) hypertension: Secondary | ICD-10-CM

## 2015-03-27 DIAGNOSIS — I351 Nonrheumatic aortic (valve) insufficiency: Secondary | ICD-10-CM | POA: Diagnosis not present

## 2015-03-27 DIAGNOSIS — Z7901 Long term (current) use of anticoagulants: Secondary | ICD-10-CM | POA: Diagnosis not present

## 2015-03-27 NOTE — Progress Notes (Signed)
Chief Complaint  Patient presents with  . Follow-up     History of Present Illness: 81 yo female with history of chronic atrial fibrillation, hypertension, hyperlipidemia, mild dementia and borderline diabetes here today for cardiac follow-up. She has been followed in the past by Dr. Haroldine Laws. I met her for the first time 08/22/12. I see her husband also. She had an echo in December 2011 with normal LV function. Mild to moderate AI.   She tells me that she is feeling well. No palpitations. Recent bronchitis with some dyspnea but improving. No chest pain.   Primary Care Physician: Dr. Ilda Basset   Last Lipid Profile: Followed in primary care.   Past Medical History  Diagnosis Date  . Chronic atrial fibrillation     echo 9/09 EF 60%, mild MR  . HTN (hypertension)   . Hyperlipidemia   . Diabetes mellitus   . Cognitive deficits 08/07/2013    Past Surgical History  Procedure Laterality Date  . Appendectomy    . Shoulder surgery      Bilateral    Current Outpatient Prescriptions  Medication Sig Dispense Refill  . acetaminophen (TYLENOL) 650 MG CR tablet Take 1,300 mg by mouth daily.      Marland Kitchen donepezil (ARICEPT) 5 MG tablet TAKE 1 TABLET EVERY DAY AT NIGHT 90 tablet 0  . fish oil-omega-3 fatty acids 1000 MG capsule Take 1 g by mouth daily.      . furosemide (LASIX) 40 MG tablet TAKE 1 TABLET BY MOUTH EVERY DAY 90 tablet 2  . hydrochlorothiazide (HYDRODIURIL) 25 MG tablet TAKE 1 TABLET DAILY 90 tablet 3  . KLOR-CON M20 20 MEQ tablet TAKE 2 TABLETS BY MOUTH EVERY DAY 180 tablet 2  . losartan (COZAAR) 25 MG tablet TAKE 1 TABLET DAILY 90 tablet 2  . metoprolol (LOPRESSOR) 100 MG tablet TAKE 1 TABLET TWICE A DAY 180 tablet 2  . NAMENDA XR 28 MG CP24 24 hr capsule TAKE 1 CAPSULE BY MOUTH EVERY DAY 90 capsule 0  . simvastatin (ZOCOR) 40 MG tablet TAKE 1 TABLET DAILY 90 tablet 3  . warfarin (COUMADIN) 5 MG tablet Take 5 mg by mouth daily. Use as directed by Anticoagulation clinic.       No current facility-administered medications for this visit.    Allergies  Allergen Reactions  . Aspirin     History   Social History  . Marital Status: Married    Spouse Name: Micheal  . Number of Children: 3  . Years of Education: [name]   Occupational History  . Retired-part time in doctors offices    Social History Main Topics  . Smoking status: Former Smoker -- 0.20 packs/day for 40 years    Types: Cigarettes    Quit date: 10/03/1982  . Smokeless tobacco: Never Used  . Alcohol Use: No  . Drug Use: No  . Sexual Activity: Not on file   Other Topics Concern  . Not on file   Social History Narrative   Married, exercises regularly. Her PCP is Dr. Truman Hayward, cardiologist is Dr. Tempie Hoist. She lives independently in a private home, is married.     Her husband is here with her.    The patient is 6, born with Erb's Palsy, has three adult children, two daughters an   d a son, 39 years younger than his sisters. The patient drinks caffeinated beverages, is a non-smoker, non-drinker, handedness: ambidexterous    Family History  Problem Relation Age of Onset  .  Cancer      family history of it.   . Cancer Mother   . Cancer Father     Review of Systems:  As stated in the HPI and otherwise negative.   BP 128/76 mmHg  Pulse 66  Ht 5' 3"  (1.6 m)  Wt 158 lb (71.668 kg)  BMI 28.00 kg/m2  Physical Examination: General: Well developed, well nourished, NAD HEENT: OP clear, mucus membranes moist SKIN: warm, dry. No rashes. Neuro: No focal deficits Musculoskeletal: Muscle strength 5/5 all ext Psychiatric: Mood and affect normal Neck: No JVD, no carotid bruits, no thyromegaly, no lymphadenopathy. Lungs:Clear bilaterally, no wheezes, rhonci, crackles Cardiovascular: Irregular irregular No murmurs, gallops or rubs. Abdomen:Soft. Bowel sounds present. Non-tender.  Extremities: No lower extremity edema. Pulses are 2 + in the bilateral DP/PT.  EKG:  EKG is not ordered today. The  ekg ordered today demonstrates   Recent Labs: No results found for requested labs within last 365 days.   Lipid Panel No results found for: CHOL, TRIG, HDL, CHOLHDL, VLDL, LDLCALC, LDLDIRECT   Wt Readings from Last 3 Encounters:  03/27/15 158 lb (71.668 kg)  03/24/15 157 lb (71.215 kg)  09/01/14 156 lb 1.9 oz (70.816 kg)     Other studies Reviewed: Additional studies/ records that were reviewed today include: . Review of the above records demonstrates:    Assessment and Plan:   1. ATRIAL FIBRILLATION: Chronic. Rate well controlled. Doing well on coumadin.   2. HYPERTENSION: BP controlled. No changes.   3. Aortic insufficiency: Mild by echo 2011. Will not repeat at this time given advanced age.   Current medicines are reviewed at length with the patient today.  The patient does not have concerns regarding medicines.  The following changes have been made:    Labs/ tests ordered today include:  No orders of the defined types were placed in this encounter.    Disposition:   FU with me in 6 months  Signed, Lauree Chandler, MD 03/27/2015 9:06 AM    [facility] [address], [place], [place]  [address] Phone: [phone]; Fax: [phone]

## 2015-03-27 NOTE — Patient Instructions (Signed)
Medication Instructions:  Your physician recommends that you continue on your current medications as directed. Please refer to the Current Medication list given to you today.   Labwork: none  Testing/Procedures: none  Follow-Up: Your physician wants you to follow-up in: 6 months.  You will receive a reminder letter in the mail two months in advance. If you don't receive a letter, please call our office to schedule the follow-up appointment.       

## 2015-04-02 ENCOUNTER — Other Ambulatory Visit: Payer: Self-pay | Admitting: Neurology

## 2015-04-20 DIAGNOSIS — I10 Essential (primary) hypertension: Secondary | ICD-10-CM | POA: Diagnosis not present

## 2015-04-20 DIAGNOSIS — Z6828 Body mass index (BMI) 28.0-28.9, adult: Secondary | ICD-10-CM | POA: Diagnosis not present

## 2015-04-20 DIAGNOSIS — Z1389 Encounter for screening for other disorder: Secondary | ICD-10-CM | POA: Diagnosis not present

## 2015-04-20 DIAGNOSIS — Z79899 Other long term (current) drug therapy: Secondary | ICD-10-CM | POA: Diagnosis not present

## 2015-04-20 DIAGNOSIS — I4891 Unspecified atrial fibrillation: Secondary | ICD-10-CM | POA: Diagnosis not present

## 2015-04-20 DIAGNOSIS — E785 Hyperlipidemia, unspecified: Secondary | ICD-10-CM | POA: Diagnosis not present

## 2015-04-20 DIAGNOSIS — E119 Type 2 diabetes mellitus without complications: Secondary | ICD-10-CM | POA: Diagnosis not present

## 2015-04-21 ENCOUNTER — Encounter: Payer: Self-pay | Admitting: Cardiovascular Disease

## 2015-04-24 DIAGNOSIS — R791 Abnormal coagulation profile: Secondary | ICD-10-CM | POA: Diagnosis not present

## 2015-05-01 DIAGNOSIS — R791 Abnormal coagulation profile: Secondary | ICD-10-CM | POA: Diagnosis not present

## 2015-05-15 DIAGNOSIS — R791 Abnormal coagulation profile: Secondary | ICD-10-CM | POA: Diagnosis not present

## 2015-05-22 DIAGNOSIS — R791 Abnormal coagulation profile: Secondary | ICD-10-CM | POA: Diagnosis not present

## 2015-05-25 ENCOUNTER — Other Ambulatory Visit: Payer: Self-pay | Admitting: Cardiovascular Disease

## 2015-05-29 DIAGNOSIS — R791 Abnormal coagulation profile: Secondary | ICD-10-CM | POA: Diagnosis not present

## 2015-06-12 DIAGNOSIS — R791 Abnormal coagulation profile: Secondary | ICD-10-CM | POA: Diagnosis not present

## 2015-06-27 ENCOUNTER — Other Ambulatory Visit: Payer: Self-pay | Admitting: Cardiovascular Disease

## 2015-06-28 ENCOUNTER — Other Ambulatory Visit: Payer: Self-pay | Admitting: Cardiovascular Disease

## 2015-06-29 NOTE — Telephone Encounter (Signed)
Burnell Blanks, MD at 03/27/2015 6:30 AM  furosemide (LASIX) 40 MG tabletTAKE 1 TABLET BY MOUTH EVERY DAY Medication Instructions:  Your physician recommends that you continue on your current medications as directed. Please refer to the Current Medication list given to you today.

## 2015-07-14 DIAGNOSIS — Z7901 Long term (current) use of anticoagulants: Secondary | ICD-10-CM | POA: Diagnosis not present

## 2015-07-15 ENCOUNTER — Other Ambulatory Visit: Payer: Self-pay | Admitting: Adult Health

## 2015-07-20 DIAGNOSIS — Z23 Encounter for immunization: Secondary | ICD-10-CM | POA: Diagnosis not present

## 2015-07-24 DIAGNOSIS — E785 Hyperlipidemia, unspecified: Secondary | ICD-10-CM | POA: Diagnosis not present

## 2015-07-24 DIAGNOSIS — I4891 Unspecified atrial fibrillation: Secondary | ICD-10-CM | POA: Diagnosis not present

## 2015-07-24 DIAGNOSIS — Z9181 History of falling: Secondary | ICD-10-CM | POA: Diagnosis not present

## 2015-07-24 DIAGNOSIS — Z79899 Other long term (current) drug therapy: Secondary | ICD-10-CM | POA: Diagnosis not present

## 2015-07-24 DIAGNOSIS — I10 Essential (primary) hypertension: Secondary | ICD-10-CM | POA: Diagnosis not present

## 2015-07-24 DIAGNOSIS — E119 Type 2 diabetes mellitus without complications: Secondary | ICD-10-CM | POA: Diagnosis not present

## 2015-07-24 DIAGNOSIS — Z6828 Body mass index (BMI) 28.0-28.9, adult: Secondary | ICD-10-CM | POA: Diagnosis not present

## 2015-07-27 ENCOUNTER — Encounter: Payer: Self-pay | Admitting: Cardiovascular Disease

## 2015-08-01 ENCOUNTER — Other Ambulatory Visit: Payer: Self-pay | Admitting: Cardiovascular Disease

## 2015-08-03 ENCOUNTER — Telehealth: Payer: Self-pay | Admitting: Cardiovascular Disease

## 2015-08-03 ENCOUNTER — Other Ambulatory Visit: Payer: Self-pay | Admitting: *Deleted

## 2015-08-03 NOTE — Telephone Encounter (Signed)
New Message   STAT if patient is at the pharmacy , call can be transferred to refill team.   1. Which medications need to be refilled? Is the pt taking ATORVASTATIN or simvastatin for cholesterol. The pt is not sure and per pharmacy has received both scripts. Please assist  2. Which pharmacy/location is medication to be sent to? [facility] on [address] [place] [phone]  3. Do they need a 30 day or 90 day supply?90 day supply

## 2015-08-03 NOTE — Telephone Encounter (Signed)
Returned call to pharmacy to verify what rx was sent from what doctor. [name] was recently placed on Atorvastatin per her PCP per Mr. And Mrs. Cislo. The pharmacy wanted to make sure which rx to refill.  Dr. Delena Bali seems to be following her lipids. There are no lipid test in Epic from our office.

## 2015-08-03 NOTE — Telephone Encounter (Signed)
Spoke with pt and informed her that Dr. Angelena Form would have pharmacy call Dr. Denton Lank office. Pt states that her husband has contacted them and they are getting it straightened out. Pt appreciative for call back.

## 2015-08-03 NOTE — Telephone Encounter (Signed)
I would have the pharmacy call Dr. Denton Lank office. cdm

## 2015-08-14 DIAGNOSIS — Z7901 Long term (current) use of anticoagulants: Secondary | ICD-10-CM | POA: Diagnosis not present

## 2015-08-26 ENCOUNTER — Other Ambulatory Visit: Payer: Self-pay | Admitting: Cardiovascular Disease

## 2015-09-14 DIAGNOSIS — Z7901 Long term (current) use of anticoagulants: Secondary | ICD-10-CM | POA: Diagnosis not present

## 2015-09-24 ENCOUNTER — Encounter: Payer: Self-pay | Admitting: Neurology

## 2015-09-24 ENCOUNTER — Ambulatory Visit (INDEPENDENT_AMBULATORY_CARE_PROVIDER_SITE_OTHER): Payer: Medicare Other | Admitting: Neurology

## 2015-09-24 VITALS — BP 112/64 | HR 74 | Resp 16 | Ht 63.0 in | Wt 154.5 lb

## 2015-09-24 DIAGNOSIS — G3184 Mild cognitive impairment, so stated: Secondary | ICD-10-CM

## 2015-09-24 MED ORDER — MEMANTINE HCL ER 28 MG PO CP24: 28.0000 mg | ORAL_CAPSULE | Freq: Every day | ORAL | Status: DC

## 2015-09-24 NOTE — Progress Notes (Signed)
PATIENT: [name] DOB: 02/06/1925  REASON FOR VISIT: follow up- memory loss HISTORY FROM: patient  HISTORY OF PRESENT ILLNESS: Ms. [name] is an 81 year old female with a history of memory loss. She returns today for follow-up. She is currently taking Namenda and Aricept and tolerating it well. The patient denies having to give up anything due to her memory. She feels that her memory has remained the same. She is able to complete all ADLs independently. Patient states that she is able to drive but typically lets her husband. The patient remains very social. Her and her husband go out every morning for breakfast with friends. The patient can recall her breakfast she can recall usually what she did throughout the day but she has a very particular for elective memory loss. This is explained by the vascular nature of her memory loss and reflects an amnestic cognitive impairment. She also reported that yesterday she went out caroling with her choir. She denies any new medical issues. mini-mental status exam= 27 out of 30 points. She lost all 3 points on immediate recall for the shortest of memory span. She wears her hearing aid. The patient can still go shopping fights usually a shopping list and that everything done this way. He has not gotten lost. Returns today for evaluation.   HISTORY 08/08/14: Ms. [name] is a 81 year old female with a history of memory loss. She returns today for follow-up. She is currently take Namenda and Aricept. The patient states that her memory has remained about the same. Denies having to give up doing anything because of her memory. The patient continues to crotchet. She continues to cook and clean her home. She does have a license but she does not drive that often. Patient states that her and her husband go out every morning to eat breakfast.They also have a group of friends that go over to the mall to walk. No new neurological complaints. The patient fell several  months ago and broke her shoulder. She states that it has been healing on its on.   HISTORY 08/07/13 ([name]): Seen here since 2012 as a referral from Dr. Delena Bali for follow up on memory loss. The patient was last seen in 02-06-13 .Patient reports that she never balanced the checkbooks, she feels that she is independent in all activities of daily living and began she does not drive anymore.The patient had started with Aricept prior to her visit with me in 2013 and had significant gastrointestinal discomfort on the 5 mg pills she was therefore switched to Namenda at that time without side effects. Since her Mini-Mental status today is declined I would like for her to try the Aricept again, now that she has been primed with the Namenda which makes it easier to tolerate the drug. She continues to be physically active and walks daily, the couple got up her breakfast every morning at a local cafeteria. She is socially active and outgoing. She reports having trouble with names, but can place the people she meets into the right context, such as ; neighbors, church. The patient endorsed today and a geriatric depression score 4 points. This is not suggestive of depression patient presents. She also was tested [facility] test today- the [facility] cognitive assessment , she had difficulties with the trail making and the visual spatial problem solving for example that she was not able today to copy a cube penetrant three-dimensional image. She was able to draw clock face but placed the hands at 10  minutes before 11 .She is able to go shopping with her husband she knows where to find the item for to look for them, she has a good idea of her pantry inventory, she scored 25-30 points today on the [facility] cognitive assessment. 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     REVIEW OF SYSTEMS: Out of a complete 14 system review of symptoms, the patient complains only of the following symptoms, and  all other reviewed systems are negative.  Hearing loss, activity change, fatigue  ALLERGIES: Allergies  Allergen Reactions  . Aspirin     HOME MEDICATIONS: Outpatient Prescriptions Prior to Visit  Medication Sig Dispense Refill  . acetaminophen (TYLENOL) 650 MG CR tablet Take 1,300 mg by mouth daily.      Marland Kitchen atorvastatin (LIPITOR) 40 MG tablet Take 40 mg by mouth at bedtime.  1  . donepezil (ARICEPT) 5 MG tablet TAKE 1 TABLET EVERY DAY AT NIGHT 90 tablet 1  . fish oil-omega-3 fatty acids 1000 MG capsule Take 1 g by mouth daily.      . furosemide (LASIX) 40 MG tablet TAKE 1 TABLET BY MOUTH EVERY DAY 30 tablet 6  . hydrochlorothiazide (HYDRODIURIL) 25 MG tablet TAKE 1 TABLET DAILY 90 tablet 3  . KLOR-CON M20 20 MEQ tablet TAKE 2 TABLETS BY MOUTH EVERY DAY 180 tablet 0  . losartan (COZAAR) 25 MG tablet TAKE 1 TABLET DAILY 90 tablet 2  . metoprolol (LOPRESSOR) 100 MG tablet TAKE 1 TABLET TWICE A DAY 180 tablet 1  . NAMENDA XR 28 MG CP24 24 hr capsule TAKE 1 CAPSULE BY MOUTH EVERY DAY 90 capsule 0  . warfarin (COUMADIN) 5 MG tablet Take 5 mg by mouth daily. Use as directed by Anticoagulation clinic.      No facility-administered medications prior to visit.    PAST MEDICAL HISTORY: Past Medical History  Diagnosis Date  . Chronic atrial fibrillation (HCC)     echo 9/09 EF 60%, mild MR  . HTN (hypertension)   . Hyperlipidemia   . Diabetes mellitus   . Cognitive deficits 08/07/2013    PAST SURGICAL HISTORY: Past Surgical History  Procedure Laterality Date  . Appendectomy    . Shoulder surgery      Bilateral    FAMILY HISTORY: Family History  Problem Relation Age of Onset  . Cancer      family history of it.   . Cancer Mother   . Cancer Father     SOCIAL HISTORY: Social History   Social History  . Marital Status: Married    Spouse Name: Micheal  . Number of Children: 3  . Years of Education: [name]   Occupational History  . Retired-part time in doctors offices     Social History Main Topics  . Smoking status: Former Smoker -- 0.20 packs/day for 40 years    Types: Cigarettes    Quit date: 10/03/1982  . Smokeless tobacco: Never Used  . Alcohol Use: No  . Drug Use: No  . Sexual Activity: Not on file   Other Topics Concern  . Not on file   Social History Narrative   Married, exercises regularly. Her PCP is Dr. Truman Hayward, cardiologist is Dr. Tempie Hoist. She lives independently in a private home, is married.     Her husband is here with her.    The patient is 58, born with Erb's Palsy, has three adult children, two daughters an   d a son, 76 years  younger than his sisters. The patient drinks caffeinated beverages, is a non-smoker, non-drinker, handedness: ambidexterous      PHYSICAL EXAM  Filed Vitals:   09/24/15 1101  BP: 112/64  Pulse: 74  Resp: 16  Height: 5\' 3"  (1.6 m)  Weight: 154 lb 8 oz (70.081 kg)   Body mass index is 27.38 kg/(m^2).  Generalized: Well developed, in no acute distress   Neurological examination  Mentation: Alert. Follows all commands speech and language fluent. MMSE 23/30  Cranial nerve : Patient did not nose notice any change in taste or smell , eats well , sleeps well. Pupils were equal round reactive to light. Extraocular movements were full, visual field were full on confrontational test. Facial sensation and strength were normal. Uvula tongue midline. Head turning and shoulder shrug  were normal and symmetric. Motor: The motor testing reveals normal strength in all extremities. Restricted mobility in the right arm due to previous shoulder injury.  Good symmetric motor tone is noted throughout.  Sensory: Sensory testing is intact to soft touch on all 4 extremities. No evidence of extinction is noted.  Coordination: Cerebellar testing reveals good finger-nose-finger- with some difficulty on the right due to previous shoulder injury.  Gait and station: Gait is normal. Tandem gait is unsteady. Romberg is negative.  No  drift is seen.  Reflexes: Deep tendon reflexes are symmetric and normal bilaterally.    DIAGNOSTIC DATA (LABS, IMAGING, TESTING) - I reviewed patient records, labs, notes, testing and imaging myself where available.   ASSESSMENT AND PLAN 81 y.o. year old female  has a past medical history of Chronic atrial fibrillation ([place]); HTN (hypertension); Hyperlipidemia; Diabetes mellitus; and Cognitive deficits (08/07/2013). here with:  1. Memory loss, short term recall, and some amnestic gaps. More MCI than dementia , based on MMSE 27-30 .   Overall the patient is doing well. Remarkably her MMSE today is 27/30 was previously 23/30.  She will continue taking Aricept and Namenda.  Patient advised that if her symptoms worsen or she develops new symptoms she she'll let us know.  Otherwise she'll follow-up in 12 months with NP.  Larey Seat, MD   Larey Seat, MD  09/24/2015, 11:07 AM [facility] [address] [place], [place] [address] [phone]  Note: This document was prepared with digital dictation and possible smart phrase technology. Any transcriptional errors that result from this process are unintentional.

## 2015-09-24 NOTE — Patient Instructions (Signed)
Cognitive Tips  Keep a journal/notebook with sections for the following (or use sections separately as needed):  Calendar and appointment sheet, schedule for each day, lists of reminders (such as grocery lists or "to do" list), homework assignments for therapy, important information such as family and friends names / addresses / phone numbers, medications, medical history and doctors name / phone numbers.  Avoid / remove clutter and unnecessary items from areas such as countertops / cabinets in kitchen and bathroom, closets, etc.  Organize items by purpose.  Baskets and bins help with this.  Leave notes for reminders above task to be completed.  For example: Note to turn off stove over the the stove; note to lock door beside the door, not to brush teeth then wash face by sink, note to take medication on table etc.  To help recall names of people of people or things, mentally or verbally go through the alphabet to try to determine the 1st letter of the word as this may trigger the name or word you are looking for.  If this is too difficult and someone else knows the word, have them give you the first letter by asking, "does it start with an "A", "B", "C" etc. (or have them give you the first sound of the word or some other clue).  Review family events, occasions, names, etc.  Pictures are a good way to trigger memory.  Have others correct you if you answer something incorrectly.  Have them speak slowly with a few words to give you time to process and respond.  Don't let others automatically problem-solve. (For example: don't let them automatically lay out clothes in the correct position, but hand it to you folded so that you can figure it out for yourself.)  However, if you need help with tasks, they should give you as little as they can so that you can be successful.  If appropriate and safe, they may allow you to make mistakes so that you can figure out how to correct the error.  (For example, they  may allow you to put your shoes on the wrong foot to see if you notice that is wrong).  If you struggle, they should give you a cue.  (Example: "Do your shoes feel right?"  "Do they look right?") 

## 2015-09-25 ENCOUNTER — Other Ambulatory Visit: Payer: Self-pay | Admitting: Cardiovascular Disease

## 2015-09-29 DIAGNOSIS — Z6827 Body mass index (BMI) 27.0-27.9, adult: Secondary | ICD-10-CM | POA: Diagnosis not present

## 2015-09-29 DIAGNOSIS — J019 Acute sinusitis, unspecified: Secondary | ICD-10-CM | POA: Diagnosis not present

## 2015-09-29 DIAGNOSIS — J208 Acute bronchitis due to other specified organisms: Secondary | ICD-10-CM | POA: Diagnosis not present

## 2015-10-05 ENCOUNTER — Other Ambulatory Visit: Payer: Self-pay | Admitting: Neurology

## 2015-10-06 NOTE — Progress Notes (Signed)
Chief Complaint  Patient presents with  . Cough     History of Present Illness: 82 yo female with history of chronic atrial fibrillation, hypertension, hyperlipidemia, mild dementia and borderline diabetes here today for cardiac follow-up. She has been followed in the past by Dr. Haroldine Laws. I met her for the first time 08/22/12. I see her husband also. She had an echo in December 2011 with normal LV function. Mild to moderate AI. She is anti-coagulated with coumadin. Rate is controlled with Lopressor.   She tells me that she is feeling well. No palpitations. No chest pain or dyspnea. She has had a recent cough and cold. She has had decreased po intake. No near syncope or syncope.   Primary Care Physician: Dr. Ilda Basset   Last Lipid Profile: Followed in primary care.   Past Medical History  Diagnosis Date  . Chronic atrial fibrillation (HCC)     echo 9/09 EF 60%, mild MR  . HTN (hypertension)   . Hyperlipidemia   . Diabetes mellitus   . Cognitive deficits 08/07/2013    Past Surgical History  Procedure Laterality Date  . Appendectomy    . Shoulder surgery      Bilateral    Current Outpatient Prescriptions  Medication Sig Dispense Refill  . acetaminophen (TYLENOL) 650 MG CR tablet Take 1,300 mg by mouth daily.      Marland Kitchen atorvastatin (LIPITOR) 40 MG tablet Take 40 mg by mouth at bedtime.  1  . benzonatate (TESSALON) 200 MG capsule Take 1 capsule by mouth 3 (three) times daily.  2  . donepezil (ARICEPT) 5 MG tablet TAKE 1 TABLET BY MOUTH EVERY NIGHT 90 tablet 3  . fish oil-omega-3 fatty acids 1000 MG capsule Take 1 g by mouth daily.      . furosemide (LASIX) 40 MG tablet TAKE 1 TABLET BY MOUTH EVERY DAY 30 tablet 6  . hydrochlorothiazide (HYDRODIURIL) 25 MG tablet TAKE 1 TABLET DAILY 90 tablet 3  . KLOR-CON M20 20 MEQ tablet TAKE 2 TABLETS BY MOUTH EVERY DAY 180 tablet 0  . levofloxacin (LEVAQUIN) 750 MG tablet TAKE 1 TABLET BY MOUTH DAILY WITH FOOD ** TAKE HALF DOSE OF  WARFARIN DAILY WHILE ON THIS ANTIBIOTIC  0  . losartan (COZAAR) 25 MG tablet TAKE 1 TABLET DAILY 90 tablet 2  . memantine (NAMENDA XR) 28 MG CP24 24 hr capsule Take 1 capsule (28 mg total) by mouth daily. 90 capsule 3  . metoprolol (LOPRESSOR) 100 MG tablet TAKE 1 TABLET TWICE A DAY 180 tablet 1  . warfarin (COUMADIN) 5 MG tablet Take 5 mg by mouth daily. Use as directed by Anticoagulation clinic.      No current facility-administered medications for this visit.    Allergies  Allergen Reactions  . Aspirin     Social History   Social History  . Marital Status: Married    Spouse Name: Micheal  . Number of Children: 3  . Years of Education: [name]   Occupational History  . Retired-part time in doctors offices    Social History Main Topics  . Smoking status: Former Smoker -- 0.20 packs/day for 40 years    Types: Cigarettes    Quit date: 10/03/1982  . Smokeless tobacco: Never Used  . Alcohol Use: No  . Drug Use: No  . Sexual Activity: Not on file   Other Topics Concern  . Not on file   Social History Narrative   Married, exercises regularly. Her PCP is Dr.  Truman Hayward, cardiologist is Dr. Tempie Hoist. She lives independently in a private home, is married.     Her husband is here with her.    The patient is 64, born with Erb's Palsy, has three adult children, two daughters an   d a son, 20 years younger than his sisters. The patient drinks caffeinated beverages, is a non-smoker, non-drinker, handedness: ambidexterous    Family History  Problem Relation Age of Onset  . Cancer      family history of it.   . Cancer Mother   . Cancer Father     Review of Systems:  As stated in the HPI and otherwise negative.   BP 90/58 mmHg  Pulse 66  Ht 5' 4"  (1.626 m)  Wt 150 lb 6.4 oz (68.221 kg)  BMI 25.80 kg/m2  Physical Examination: General: Well developed, well nourished, NAD HEENT: OP clear, mucus membranes moist SKIN: warm, dry. No rashes. Neuro: No focal deficits Musculoskeletal:  Muscle strength 5/5 all ext Psychiatric: Mood and affect normal Neck: No JVD, no carotid bruits, no thyromegaly, no lymphadenopathy. Lungs:Clear bilaterally, no wheezes, rhonci, crackles Cardiovascular: Irregular irregular No murmurs, gallops or rubs. Abdomen:Soft. Bowel sounds present. Non-tender.  Extremities: No lower extremity edema. Pulses are 2 + in the bilateral DP/PT.  EKG:  EKG is ordered today. The ekg ordered today demonstrates atrial fib, rate 78 bpm. Non-specific ST and T wave abnormalities.   Recent Labs: No results found for requested labs within last 365 days.   Lipid Panel No results found for: CHOL, TRIG, HDL, CHOLHDL, VLDL, LDLCALC, LDLDIRECT   Wt Readings from Last 3 Encounters:  10/07/15 150 lb 6.4 oz (68.221 kg)  09/24/15 154 lb 8 oz (70.081 kg)  03/27/15 158 lb (71.668 kg)     Other studies Reviewed: Additional studies/ records that were reviewed today include: . Review of the above records demonstrates:    Assessment and Plan:   1. ATRIAL FIBRILLATION, persisent: Chronic. Rate well controlled on Lopressor. Doing well on coumadin. No bleeding.   2. HYPERTENSION: BP controlled and actually low today with recent decreased po intake. Will have her hold HCTZ for one week while she is getting over her viral illness.   3. Aortic insufficiency: Mild by echo 2011. Will not repeat at this time given advanced age.   Current medicines are reviewed at length with the patient today.  The patient does not have concerns regarding medicines.  The following changes have been made:    Labs/ tests ordered today include:  No orders of the defined types were placed in this encounter.    Disposition:   FU with me in 6 months  Signed, Lauree Chandler, MD 10/07/2015 9:01 AM    [facility] [address], [place], [place]  [address] Phone: [phone]; Fax: [phone]

## 2015-10-07 ENCOUNTER — Encounter: Payer: Self-pay | Admitting: Cardiovascular Disease

## 2015-10-07 ENCOUNTER — Ambulatory Visit (INDEPENDENT_AMBULATORY_CARE_PROVIDER_SITE_OTHER): Payer: Medicare Other | Admitting: Cardiovascular Disease

## 2015-10-07 VITALS — BP 90/58 | HR 66 | Ht 64.0 in | Wt 150.4 lb

## 2015-10-07 DIAGNOSIS — I10 Essential (primary) hypertension: Secondary | ICD-10-CM | POA: Diagnosis not present

## 2015-10-07 DIAGNOSIS — I351 Nonrheumatic aortic (valve) insufficiency: Secondary | ICD-10-CM

## 2015-10-07 DIAGNOSIS — I4819 Other persistent atrial fibrillation: Secondary | ICD-10-CM

## 2015-10-07 DIAGNOSIS — I481 Persistent atrial fibrillation: Secondary | ICD-10-CM | POA: Diagnosis not present

## 2015-10-07 NOTE — Patient Instructions (Signed)
Medication Instructions:  Your physician recommends that you continue on your current medications as directed. Please refer to the Current Medication list given to you today.   Labwork: none  Testing/Procedures: none  Follow-Up: Your physician wants you to follow-up in: 6 months.  You will receive a reminder letter in the mail two months in advance. If you don't receive a letter, please call our office to schedule the follow-up appointment.    Stop taking Hydrochlorothiazide for one week and then resume.      If you need a refill on your cardiac medications before your next appointment, please call your pharmacy.

## 2015-10-09 ENCOUNTER — Other Ambulatory Visit: Payer: Self-pay | Admitting: Adult Health

## 2015-10-19 DIAGNOSIS — Z7901 Long term (current) use of anticoagulants: Secondary | ICD-10-CM | POA: Diagnosis not present

## 2015-10-26 DIAGNOSIS — R791 Abnormal coagulation profile: Secondary | ICD-10-CM | POA: Diagnosis not present

## 2015-10-30 DIAGNOSIS — I10 Essential (primary) hypertension: Secondary | ICD-10-CM | POA: Diagnosis not present

## 2015-10-30 DIAGNOSIS — Z6826 Body mass index (BMI) 26.0-26.9, adult: Secondary | ICD-10-CM | POA: Diagnosis not present

## 2015-10-30 DIAGNOSIS — Z79899 Other long term (current) drug therapy: Secondary | ICD-10-CM | POA: Diagnosis not present

## 2015-10-30 DIAGNOSIS — I4891 Unspecified atrial fibrillation: Secondary | ICD-10-CM | POA: Diagnosis not present

## 2015-10-30 DIAGNOSIS — R7301 Impaired fasting glucose: Secondary | ICD-10-CM | POA: Diagnosis not present

## 2015-10-30 DIAGNOSIS — E785 Hyperlipidemia, unspecified: Secondary | ICD-10-CM | POA: Diagnosis not present

## 2015-11-02 DIAGNOSIS — R791 Abnormal coagulation profile: Secondary | ICD-10-CM | POA: Diagnosis not present

## 2015-11-04 ENCOUNTER — Encounter: Payer: Self-pay | Admitting: Cardiovascular Disease

## 2015-11-04 DIAGNOSIS — R791 Abnormal coagulation profile: Secondary | ICD-10-CM | POA: Diagnosis not present

## 2015-11-06 DIAGNOSIS — R791 Abnormal coagulation profile: Secondary | ICD-10-CM | POA: Diagnosis not present

## 2015-11-09 DIAGNOSIS — E119 Type 2 diabetes mellitus without complications: Secondary | ICD-10-CM | POA: Diagnosis not present

## 2015-11-13 DIAGNOSIS — R791 Abnormal coagulation profile: Secondary | ICD-10-CM | POA: Diagnosis not present

## 2015-11-18 ENCOUNTER — Other Ambulatory Visit: Payer: Self-pay | Admitting: Cardiovascular Disease

## 2015-11-21 ENCOUNTER — Other Ambulatory Visit: Payer: Self-pay | Admitting: Cardiovascular Disease

## 2015-11-27 DIAGNOSIS — R791 Abnormal coagulation profile: Secondary | ICD-10-CM | POA: Diagnosis not present

## 2015-12-04 DIAGNOSIS — R791 Abnormal coagulation profile: Secondary | ICD-10-CM | POA: Diagnosis not present

## 2015-12-08 DIAGNOSIS — Z6826 Body mass index (BMI) 26.0-26.9, adult: Secondary | ICD-10-CM | POA: Diagnosis not present

## 2015-12-08 DIAGNOSIS — M4316 Spondylolisthesis, lumbar region: Secondary | ICD-10-CM | POA: Diagnosis not present

## 2015-12-08 DIAGNOSIS — J208 Acute bronchitis due to other specified organisms: Secondary | ICD-10-CM | POA: Diagnosis not present

## 2015-12-18 DIAGNOSIS — R791 Abnormal coagulation profile: Secondary | ICD-10-CM | POA: Diagnosis not present

## 2015-12-25 DIAGNOSIS — R791 Abnormal coagulation profile: Secondary | ICD-10-CM | POA: Diagnosis not present

## 2016-01-01 DIAGNOSIS — R791 Abnormal coagulation profile: Secondary | ICD-10-CM | POA: Diagnosis not present

## 2016-01-14 DIAGNOSIS — R791 Abnormal coagulation profile: Secondary | ICD-10-CM | POA: Diagnosis not present

## 2016-01-28 DIAGNOSIS — F0150 Vascular dementia without behavioral disturbance: Secondary | ICD-10-CM | POA: Diagnosis not present

## 2016-01-28 DIAGNOSIS — I10 Essential (primary) hypertension: Secondary | ICD-10-CM | POA: Diagnosis not present

## 2016-01-28 DIAGNOSIS — Z79899 Other long term (current) drug therapy: Secondary | ICD-10-CM | POA: Diagnosis not present

## 2016-01-28 DIAGNOSIS — R6889 Other general symptoms and signs: Secondary | ICD-10-CM | POA: Diagnosis not present

## 2016-01-28 DIAGNOSIS — E785 Hyperlipidemia, unspecified: Secondary | ICD-10-CM | POA: Diagnosis not present

## 2016-01-28 DIAGNOSIS — I4891 Unspecified atrial fibrillation: Secondary | ICD-10-CM | POA: Diagnosis not present

## 2016-01-28 DIAGNOSIS — R7301 Impaired fasting glucose: Secondary | ICD-10-CM | POA: Diagnosis not present

## 2016-01-28 DIAGNOSIS — Z6826 Body mass index (BMI) 26.0-26.9, adult: Secondary | ICD-10-CM | POA: Diagnosis not present

## 2016-01-29 ENCOUNTER — Encounter: Payer: Self-pay | Admitting: Cardiovascular Disease

## 2016-02-05 DIAGNOSIS — J208 Acute bronchitis due to other specified organisms: Secondary | ICD-10-CM | POA: Diagnosis not present

## 2016-02-05 DIAGNOSIS — Z6826 Body mass index (BMI) 26.0-26.9, adult: Secondary | ICD-10-CM | POA: Diagnosis not present

## 2016-02-05 DIAGNOSIS — E663 Overweight: Secondary | ICD-10-CM | POA: Diagnosis not present

## 2016-02-15 DIAGNOSIS — Z23 Encounter for immunization: Secondary | ICD-10-CM | POA: Diagnosis not present

## 2016-02-15 DIAGNOSIS — Z6825 Body mass index (BMI) 25.0-25.9, adult: Secondary | ICD-10-CM | POA: Diagnosis not present

## 2016-02-15 DIAGNOSIS — S51011A Laceration without foreign body of right elbow, initial encounter: Secondary | ICD-10-CM | POA: Diagnosis not present

## 2016-02-15 DIAGNOSIS — Z7901 Long term (current) use of anticoagulants: Secondary | ICD-10-CM | POA: Diagnosis not present

## 2016-02-15 DIAGNOSIS — S51001A Unspecified open wound of right elbow, initial encounter: Secondary | ICD-10-CM | POA: Diagnosis not present

## 2016-02-22 ENCOUNTER — Other Ambulatory Visit: Payer: Self-pay | Admitting: Cardiovascular Disease

## 2016-02-22 DIAGNOSIS — Z7901 Long term (current) use of anticoagulants: Secondary | ICD-10-CM | POA: Diagnosis not present

## 2016-03-01 DIAGNOSIS — R791 Abnormal coagulation profile: Secondary | ICD-10-CM | POA: Diagnosis not present

## 2016-03-04 DIAGNOSIS — R791 Abnormal coagulation profile: Secondary | ICD-10-CM | POA: Diagnosis not present

## 2016-03-11 DIAGNOSIS — R791 Abnormal coagulation profile: Secondary | ICD-10-CM | POA: Diagnosis not present

## 2016-03-19 ENCOUNTER — Other Ambulatory Visit: Payer: Self-pay | Admitting: Cardiovascular Disease

## 2016-03-25 DIAGNOSIS — Z7901 Long term (current) use of anticoagulants: Secondary | ICD-10-CM | POA: Diagnosis not present

## 2016-04-25 DIAGNOSIS — Z7901 Long term (current) use of anticoagulants: Secondary | ICD-10-CM | POA: Diagnosis not present

## 2016-05-04 DIAGNOSIS — I10 Essential (primary) hypertension: Secondary | ICD-10-CM | POA: Diagnosis not present

## 2016-05-04 DIAGNOSIS — I4891 Unspecified atrial fibrillation: Secondary | ICD-10-CM | POA: Diagnosis not present

## 2016-05-04 DIAGNOSIS — E785 Hyperlipidemia, unspecified: Secondary | ICD-10-CM | POA: Diagnosis not present

## 2016-05-04 DIAGNOSIS — Z6825 Body mass index (BMI) 25.0-25.9, adult: Secondary | ICD-10-CM | POA: Diagnosis not present

## 2016-05-04 DIAGNOSIS — F0150 Vascular dementia without behavioral disturbance: Secondary | ICD-10-CM | POA: Diagnosis not present

## 2016-05-04 DIAGNOSIS — R7301 Impaired fasting glucose: Secondary | ICD-10-CM | POA: Diagnosis not present

## 2016-05-04 DIAGNOSIS — Z79899 Other long term (current) drug therapy: Secondary | ICD-10-CM | POA: Diagnosis not present

## 2016-05-05 ENCOUNTER — Ambulatory Visit (INDEPENDENT_AMBULATORY_CARE_PROVIDER_SITE_OTHER): Payer: Medicare Other | Admitting: Cardiovascular Disease

## 2016-05-05 ENCOUNTER — Encounter: Payer: Self-pay | Admitting: Cardiovascular Disease

## 2016-05-05 VITALS — BP 106/60 | HR 98 | Ht 64.0 in | Wt 143.6 lb

## 2016-05-05 DIAGNOSIS — I351 Nonrheumatic aortic (valve) insufficiency: Secondary | ICD-10-CM | POA: Diagnosis not present

## 2016-05-05 DIAGNOSIS — I10 Essential (primary) hypertension: Secondary | ICD-10-CM | POA: Diagnosis not present

## 2016-05-05 DIAGNOSIS — I481 Persistent atrial fibrillation: Secondary | ICD-10-CM | POA: Diagnosis not present

## 2016-05-05 DIAGNOSIS — I4819 Other persistent atrial fibrillation: Secondary | ICD-10-CM

## 2016-05-05 NOTE — Patient Instructions (Signed)

## 2016-05-05 NOTE — Progress Notes (Signed)
Chief Complaint  Patient presents with  . Follow-up    6 month     History of Present Illness: 82 yo female with history of chronic atrial fibrillation, hypertension, hyperlipidemia, mild dementia and mild AI who is here today for cardiac follow-up. She has been followed in the past by Dr. Haroldine Laws. I met her for the first time 08/22/12. I see her husband also. She had an echo in December 2011 with normal LV function. Mild to moderate AI. She is anti-coagulated with coumadin. Rate is controlled with Lopressor.   She tells me that she is feeling well. No palpitations. No chest pain or dyspnea. She has had decreased po intake. No near syncope or syncope.   Primary Care Physician: Nicoletta Dress, MD  Last Lipid Profile: Followed in primary care.   Past Medical History:  Diagnosis Date  . Chronic atrial fibrillation (HCC)    echo 9/09 EF 60%, mild MR  . Cognitive deficits 08/07/2013  . Diabetes mellitus   . HTN (hypertension)   . Hyperlipidemia     Past Surgical History:  Procedure Laterality Date  . APPENDECTOMY    . SHOULDER SURGERY     Bilateral    Current Outpatient Prescriptions  Medication Sig Dispense Refill  . atorvastatin (LIPITOR) 40 MG tablet Take 40 mg by mouth at bedtime.  1  . benzonatate (TESSALON) 200 MG capsule Take 1 capsule by mouth 3 (three) times daily.  2  . donepezil (ARICEPT) 5 MG tablet TAKE 1 TABLET BY MOUTH EVERY NIGHT 90 tablet 3  . fish oil-omega-3 fatty acids 1000 MG capsule Take 1 g by mouth daily.      . furosemide (LASIX) 40 MG tablet TAKE 1 TABLET BY MOUTH EVERY DAY 30 tablet 6  . KLOR-CON M20 20 MEQ tablet TAKE 2 TABLETS BY MOUTH EVERY DAY 180 tablet 0  . losartan (COZAAR) 25 MG tablet TAKE 1 TABLET BY MOUTH DAILY 90 tablet 3  . memantine (NAMENDA XR) 28 MG CP24 24 hr capsule Take 1 capsule (28 mg total) by mouth daily. 90 capsule 3  . metoprolol (LOPRESSOR) 100 MG tablet Take 1 tablet (100 mg total) by mouth 2 (two) times daily. 180  tablet 3  . warfarin (COUMADIN) 5 MG tablet Take 5 mg by mouth daily. Use as directed by Anticoagulation clinic.      No current facility-administered medications for this visit.     Allergies  Allergen Reactions  . Aspirin     Social History   Social History  . Marital status: Married    Spouse name: Micheal  . Number of children: 3  . Years of education: [name]   Occupational History  . Retired-part time in doctors offices Retired   Social History Main Topics  . Smoking status: Former Smoker    Packs/day: 0.20    Years: 40.00    Types: Cigarettes    Quit date: 10/03/1982  . Smokeless tobacco: Never Used  . Alcohol use No  . Drug use: No  . Sexual activity: Not on file   Other Topics Concern  . Not on file   Social History Narrative   Married, exercises regularly. Her PCP is Dr. Truman Hayward, cardiologist is Dr. Tempie Hoist. She lives independently in a private home, is married.     Her husband is here with her.    The patient is 35, born with Erb's Palsy, has three adult children, two daughters an   d a son, 89 years younger than  his sisters. The patient drinks caffeinated beverages, is a non-smoker, non-drinker, handedness: ambidexterous    Family History  Problem Relation Age of Onset  . Cancer Mother   . Cancer Father   . Cancer      family history of it.     Review of Systems:  As stated in the HPI and otherwise negative.   BP 106/60   Pulse 98   Ht 5' 4"  (1.626 m)   Wt 143 lb 9.6 oz (65.1 kg)   BMI 24.65 kg/m   Physical Examination: General: Well developed, well nourished, NAD  HEENT: OP clear, mucus membranes moist  SKIN: warm, dry. No rashes. Neuro: No focal deficits  Musculoskeletal: Muscle strength 5/5 all ext  Psychiatric: Mood and affect normal  Neck: No JVD, no carotid bruits, no thyromegaly, no lymphadenopathy.  Lungs:Clear bilaterally, no wheezes, rhonci, crackles Cardiovascular: Irregular irregular No murmurs, gallops or rubs. Abdomen:Soft. Bowel  sounds present. Non-tender.  Extremities: No lower extremity edema. Pulses are 2 + in the bilateral DP/PT.  EKG:  EKG is not ordered today. The ekg ordered today demonstrates   Recent Labs: No results found for requested labs within last 8760 hours.   Lipid Panel No results found for: CHOL, TRIG, HDL, CHOLHDL, VLDL, LDLCALC, LDLDIRECT   Wt Readings from Last 3 Encounters:  05/05/16 143 lb 9.6 oz (65.1 kg)  10/07/15 150 lb 6.4 oz (68.2 kg)  09/24/15 154 lb 8 oz (70.1 kg)     Other studies Reviewed: Additional studies/ records that were reviewed today include: . Review of the above records demonstrates:    Assessment and Plan:   1. ATRIAL FIBRILLATION, persisent: Chronic. Rate well controlled on Lopressor. Doing well on coumadin. No bleeding.   2. HYPERTENSION: BP controlled. No changes.    3. Aortic insufficiency: Mild by echo 2011. Will not repeat at this time given advanced age.   Current medicines are reviewed at length with the patient today.  The patient does not have concerns regarding medicines.  The following changes have been made:    Labs/ tests ordered today include:  No orders of the defined types were placed in this encounter.   Disposition:   FU with me in 6 months  Signed, Lauree Chandler, MD 05/05/2016 3:16 PM    [facility] [place], [place], [place]  [address] Phone: [phone]; Fax: [phone]

## 2016-05-06 ENCOUNTER — Encounter: Payer: Self-pay | Admitting: Cardiovascular Disease

## 2016-05-24 ENCOUNTER — Other Ambulatory Visit: Payer: Self-pay | Admitting: Cardiovascular Disease

## 2016-05-26 DIAGNOSIS — Z7901 Long term (current) use of anticoagulants: Secondary | ICD-10-CM | POA: Diagnosis not present

## 2016-06-27 DIAGNOSIS — Z7901 Long term (current) use of anticoagulants: Secondary | ICD-10-CM | POA: Diagnosis not present

## 2016-07-22 ENCOUNTER — Other Ambulatory Visit: Payer: Self-pay | Admitting: *Deleted

## 2016-07-22 MED ORDER — FUROSEMIDE 40 MG PO TABS: 40.0000 mg | ORAL_TABLET | Freq: Every day | ORAL | 2 refills | Status: DC

## 2016-07-28 DIAGNOSIS — Z7901 Long term (current) use of anticoagulants: Secondary | ICD-10-CM | POA: Diagnosis not present

## 2016-07-28 DIAGNOSIS — Z23 Encounter for immunization: Secondary | ICD-10-CM | POA: Diagnosis not present

## 2016-08-11 ENCOUNTER — Encounter: Payer: Self-pay | Admitting: Cardiovascular Disease

## 2016-08-11 DIAGNOSIS — R7301 Impaired fasting glucose: Secondary | ICD-10-CM | POA: Diagnosis not present

## 2016-08-11 DIAGNOSIS — E785 Hyperlipidemia, unspecified: Secondary | ICD-10-CM | POA: Diagnosis not present

## 2016-08-11 DIAGNOSIS — Z6825 Body mass index (BMI) 25.0-25.9, adult: Secondary | ICD-10-CM | POA: Diagnosis not present

## 2016-08-11 DIAGNOSIS — Z79899 Other long term (current) drug therapy: Secondary | ICD-10-CM | POA: Diagnosis not present

## 2016-08-11 DIAGNOSIS — Z9181 History of falling: Secondary | ICD-10-CM | POA: Diagnosis not present

## 2016-08-11 DIAGNOSIS — Z23 Encounter for immunization: Secondary | ICD-10-CM | POA: Diagnosis not present

## 2016-08-11 DIAGNOSIS — F0150 Vascular dementia without behavioral disturbance: Secondary | ICD-10-CM | POA: Diagnosis not present

## 2016-08-11 DIAGNOSIS — I10 Essential (primary) hypertension: Secondary | ICD-10-CM | POA: Diagnosis not present

## 2016-08-11 DIAGNOSIS — I4891 Unspecified atrial fibrillation: Secondary | ICD-10-CM | POA: Diagnosis not present

## 2016-08-29 DIAGNOSIS — Z7901 Long term (current) use of anticoagulants: Secondary | ICD-10-CM | POA: Diagnosis not present

## 2016-09-14 ENCOUNTER — Other Ambulatory Visit: Payer: Self-pay | Admitting: Neurology

## 2016-09-20 ENCOUNTER — Encounter: Payer: Self-pay | Admitting: Neurology

## 2016-09-20 ENCOUNTER — Ambulatory Visit (INDEPENDENT_AMBULATORY_CARE_PROVIDER_SITE_OTHER): Payer: Medicare Other | Admitting: Neurology

## 2016-09-20 VITALS — BP 100/64 | HR 88 | Resp 14 | Ht 64.0 in | Wt 139.0 lb

## 2016-09-20 DIAGNOSIS — F0280 Dementia in other diseases classified elsewhere without behavioral disturbance: Secondary | ICD-10-CM | POA: Diagnosis not present

## 2016-09-20 DIAGNOSIS — G301 Alzheimer's disease with late onset: Secondary | ICD-10-CM

## 2016-09-20 DIAGNOSIS — G3184 Mild cognitive impairment, so stated: Secondary | ICD-10-CM | POA: Diagnosis not present

## 2016-09-20 MED ORDER — MEMANTINE HCL 10 MG PO TABS: 10.0000 mg | ORAL_TABLET | Freq: Two times a day (BID) | ORAL | 3 refills | Status: DC

## 2016-09-20 MED ORDER — DONEPEZIL HCL 5 MG PO TABS: 5.0000 mg | ORAL_TABLET | Freq: Every day | ORAL | 3 refills | Status: DC

## 2016-09-20 NOTE — Patient Instructions (Signed)
I have changed the patient back from Namenda are 28 mg daily the generic form of Namenda 10 mg twice a day. I hope that this would be a significant reduction in costs.

## 2016-09-20 NOTE — Progress Notes (Signed)
PATIENT: [name] DOB: 16-Dec-1924  REASON FOR VISIT: follow up- memory loss HISTORY FROM: patient  HISTORY OF PRESENT ILLNESS:  Interval history from 09/20/2016 [name] is seen year today for her yearly memory check, I have followed her yearly since 2013. Her Mini-Mental Status Examination today revealed 21 out of 30 points but it was last year 27 out of 30. She has more mobility difficulties, difficulties with getting in and out of a car even abandoned. This year she did not go Christmas caroling. She repeats the same sentences over and over " don't work to hard" , and accused her husband of taking money or not allowing her to take money from the spending account- which he never said. She does word searches. He husband is still actively gardening. They think about down seizing and moving to a house with easier upkeep.  Her MMSE declined , see below. Continue Namenda and Aricept.    NP: [name] is an 82 year old female with a history of memory loss.  She is currently taking Namenda and Aricept and tolerating it well. The patient denies having to give up anything due to her memory. She feels that her memory has remained the same. She is able to complete all ADLs independently. Patient states that she is able to drive but typically lets her husband. The patient remains very social. Her and her husband go out every morning for breakfast with friends. The patient can recall her breakfast she can recall usually what she did throughout the day but she has a very particular for elective memory loss. This is explained by the vascular nature of her memory loss and reflects an amnestic cognitive impairment. She also reported that yesterday she went out caroling with her choir. She denies any new medical issues. mini-mental status exam= 27 out of 30 points. She lost all 3 points on immediate recall for the shortest of memory span. She wears her hearing aid. The patient can still go shopping  usually a shopping list and that everything done this way. She  has not gotten lost. Returns today for evaluation.   HISTORY 08/08/14: [name] is a 82 year old female with a history of memory loss. She returns today for follow-up. She is currently take Namenda and Aricept. The patient states that her memory has remained about the same. Denies having to give up doing anything because of her memory. The patient continues to crotchet. She continues to cook and clean her home. She does have a license but she does not drive that often. Patient states that her and her husband go out every morning to eat breakfast.They also have a group of friends that go over to the mall to walk. No new neurological complaints. The patient fell several months ago and broke her shoulder. She states that it has been healing on its on.   HISTORY 08/07/13 ([name]): Seen here since 2012 as a referral from Dr. Delena Bali for follow up on memory loss. The patient was last seen in 02-06-13 .Patient reports that she never balanced the checkbooks, she feels that she is independent in all activities of daily living and began she does not drive anymore.The patient had started with Aricept prior to her visit with me in 2013 and had significant gastrointestinal discomfort on the 5 mg pills she was therefore switched to Namenda at that time without side effects. Since her Mini-Mental status today is declined I would like for her to try the Aricept again, now  that she has been primed with the Namenda which makes it easier to tolerate the drug. She continues to be physically active and walks daily, the couple got up her breakfast every morning at a local cafeteria. She is socially active and outgoing. She reports having trouble with names, but can place the people she meets into the right context, such as ; neighbors, church. The patient endorsed today and a geriatric depression score 4 points. This is not suggestive of depression patient  presents. She also was tested [facility] test today- the [facility] cognitive assessment , she had difficulties with the trail making and the visual spatial problem solving for example that she was not able today to copy a cube penetrant three-dimensional image. She was able to draw clock face but placed the hands at 10 minutes before 11 .She is able to go shopping with her husband she knows where to find the item for to look for them, she has a good idea of her pantry inventory, she scored 25-30 points today on the [facility] cognitive assessment. 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     REVIEW OF SYSTEMS: Out of a complete 14 system review of symptoms, the patient complains only of the following symptoms, and all other reviewed systems are negative.  Hearing loss, activity change, fatigue  ALLERGIES: Allergies  Allergen Reactions  . Aspirin     HOME MEDICATIONS: Outpatient Medications Prior to Visit  Medication Sig Dispense Refill  . atorvastatin (LIPITOR) 40 MG tablet Take 40 mg by mouth at bedtime.  1  . donepezil (ARICEPT) 5 MG tablet TAKE 1 TABLET BY MOUTH EVERY NIGHT 90 tablet 3  . fish oil-omega-3 fatty acids 1000 MG capsule Take 1 g by mouth daily.      . furosemide (LASIX) 40 MG tablet Take 1 tablet (40 mg total) by mouth daily. 90 tablet 2  . KLOR-CON M20 20 MEQ tablet TAKE 2 TABLETS BY MOUTH EVERY DAY 180 tablet 3  . losartan (COZAAR) 25 MG tablet TAKE 1 TABLET BY MOUTH DAILY 90 tablet 3  . memantine (NAMENDA XR) 28 MG CP24 24 hr capsule Take 1 capsule (28 mg total) by mouth daily. 90 capsule 3  . metoprolol (LOPRESSOR) 100 MG tablet Take 1 tablet (100 mg total) by mouth 2 (two) times daily. 180 tablet 3  . warfarin (COUMADIN) 5 MG tablet Take 5 mg by mouth daily. Use as directed by Anticoagulation clinic.     . benzonatate (TESSALON) 200 MG capsule Take 1 capsule by mouth 3 (three) times daily.  2   No facility-administered medications prior to  visit.     PAST MEDICAL HISTORY: Past Medical History:  Diagnosis Date  . Chronic atrial fibrillation (HCC)    echo 9/09 EF 60%, mild MR  . Cognitive deficits 08/07/2013  . Diabetes mellitus   . HTN (hypertension)   . Hyperlipidemia     PAST SURGICAL HISTORY: Past Surgical History:  Procedure Laterality Date  . APPENDECTOMY    . SHOULDER SURGERY     Bilateral    FAMILY HISTORY: Family History  Problem Relation Age of Onset  . Cancer Mother   . Cancer Father   . Cancer      family history of it.     SOCIAL HISTORY: Social History   Social History  . Marital status: Married    Spouse name: Micheal  . Number of children: 3  . Years of education: [name]  Occupational History  . Retired-part time in doctors offices Retired   Social History Main Topics  . Smoking status: Former Smoker    Packs/day: 0.20    Years: 40.00    Types: Cigarettes    Quit date: 10/03/1982  . Smokeless tobacco: Never Used  . Alcohol use No  . Drug use: No  . Sexual activity: Not on file   Other Topics Concern  . Not on file   Social History Narrative   Married, exercises regularly. Her PCP is Dr. Truman Hayward, cardiologist is Dr. Tempie Hoist. She lives independently in a private home, is married.     Her husband is here with her.    The patient is 84, born with Erb's Palsy, has three adult children, two daughters an   d a son, 60 years younger than his sisters. The patient drinks caffeinated beverages, is a non-smoker, non-drinker, handedness: ambidexterous      PHYSICAL EXAM  Vitals:   09/20/16 1259  BP: 100/64  Pulse: 88  Resp: 14  Weight: 139 lb (63 kg)  Height: 5\' 4"  (1.626 m)   Body mass index is 23.86 kg/m.  Generalized: Well developed, in no acute distress   Neurological examination  Mentation: Alert. Follows all commands speech and language fluent. MMSE 23/30  Cranial nerve : Patient did not nose notice any change in taste or smell , eats well , sleeps well. Pupils were  equal round reactive to light. Extraocular movements were full, visual field were full on confrontational test. Facial sensation and strength were normal. Uvula tongue midline. Head turning and shoulder shrug  were normal and symmetric. Motor: The motor testing reveals normal strength in all extremities. Restricted mobility in the right arm due to previous shoulder injury.  Good symmetric motor tone is noted throughout.  Sensory: Sensory testing is intact to soft touch on all 4 extremities. No evidence of extinction is noted.  Coordination: Cerebellar testing reveals good finger-nose-finger- with some difficulty on the right due to previous shoulder injury.  Gait and station: Gait is normal. Tandem gait is unsteady. Romberg is negative.  No drift is seen.  Reflexes: Deep tendon reflexes are symmetric and normal bilaterally.    DIAGNOSTIC DATA (LABS, IMAGING, TESTING) - I reviewed patient records, labs, notes, testing and imaging myself where available.   ASSESSMENT AND PLAN 82 y.o. year old female  has a past medical history of Chronic atrial fibrillation ([place]); Cognitive deficits (08/07/2013); Diabetes mellitus; HTN (hypertension); and Hyperlipidemia. here with:  1. Memory loss, short term recall, and some amnestic gaps. More Dementia.   2. No recent atrial fib.   Overall the patient is doing well. Remarkably her MMSE today is 21/30 and last year  27/30 and was previously ( 2015)  23/30.  She will continue taking Aricept and Namenda.  Patient advised that if her symptoms worsen or she develops new symptoms she she'll let us know.  Otherwise she'll follow-up in 12 months with NP.  Larey Seat, MD   Larey Seat, MD  09/20/2016, 1:10 PM [facility] [address], [place], [place] [address] [phone]  Note: This document was prepared with digital dictation and possible smart phrase technology. Any transcriptional errors that result from this  process are unintentional.

## 2016-09-28 ENCOUNTER — Ambulatory Visit: Payer: Medicare Other | Admitting: Neurology

## 2016-09-29 DIAGNOSIS — Z7901 Long term (current) use of anticoagulants: Secondary | ICD-10-CM | POA: Diagnosis not present

## 2016-10-24 DIAGNOSIS — M1712 Unilateral primary osteoarthritis, left knee: Secondary | ICD-10-CM | POA: Diagnosis not present

## 2016-11-01 DIAGNOSIS — Z7901 Long term (current) use of anticoagulants: Secondary | ICD-10-CM | POA: Diagnosis not present

## 2016-11-12 ENCOUNTER — Telehealth: Payer: Self-pay | Admitting: Neurology

## 2016-11-12 DIAGNOSIS — G301 Alzheimer's disease with late onset: Secondary | ICD-10-CM

## 2016-11-12 DIAGNOSIS — F0280 Dementia in other diseases classified elsewhere without behavioral disturbance: Secondary | ICD-10-CM

## 2016-11-12 DIAGNOSIS — G3184 Mild cognitive impairment, so stated: Secondary | ICD-10-CM

## 2016-11-16 ENCOUNTER — Other Ambulatory Visit: Payer: Self-pay | Admitting: Cardiovascular Disease

## 2016-11-16 MED ORDER — MEMANTINE HCL 10 MG PO TABS: 10.0000 mg | ORAL_TABLET | Freq: Two times a day (BID) | ORAL | 3 refills | Status: DC

## 2016-11-16 NOTE — Telephone Encounter (Signed)
Rx printed, once signed I will fax into pharmacy.

## 2016-11-16 NOTE — Addendum Note (Signed)
Addended by: Laurence Spates on: 11/16/2016 03:15 PM   Modules accepted: Orders

## 2016-11-16 NOTE — Telephone Encounter (Signed)
Patient's husband calling to get a refill for memantine (NAMENDA) 10 MG tablet [facility] [address] in [place] for the patient.. The pharmacy said was denied because change in dosage. The patient only has 2 more pills. Please call patient's husband and advise.

## 2016-11-17 DIAGNOSIS — Z79899 Other long term (current) drug therapy: Secondary | ICD-10-CM | POA: Diagnosis not present

## 2016-11-17 DIAGNOSIS — Z1389 Encounter for screening for other disorder: Secondary | ICD-10-CM | POA: Diagnosis not present

## 2016-11-17 DIAGNOSIS — R7301 Impaired fasting glucose: Secondary | ICD-10-CM | POA: Diagnosis not present

## 2016-11-17 DIAGNOSIS — F0150 Vascular dementia without behavioral disturbance: Secondary | ICD-10-CM | POA: Diagnosis not present

## 2016-11-17 DIAGNOSIS — E785 Hyperlipidemia, unspecified: Secondary | ICD-10-CM | POA: Diagnosis not present

## 2016-11-17 DIAGNOSIS — I10 Essential (primary) hypertension: Secondary | ICD-10-CM | POA: Diagnosis not present

## 2016-11-17 DIAGNOSIS — R791 Abnormal coagulation profile: Secondary | ICD-10-CM | POA: Diagnosis not present

## 2016-11-17 DIAGNOSIS — Z9181 History of falling: Secondary | ICD-10-CM | POA: Diagnosis not present

## 2016-11-17 DIAGNOSIS — I4891 Unspecified atrial fibrillation: Secondary | ICD-10-CM | POA: Diagnosis not present

## 2016-11-30 DIAGNOSIS — M1712 Unilateral primary osteoarthritis, left knee: Secondary | ICD-10-CM | POA: Diagnosis not present

## 2016-12-01 ENCOUNTER — Encounter: Payer: Self-pay | Admitting: Cardiovascular Disease

## 2016-12-04 DIAGNOSIS — M25552 Pain in left hip: Secondary | ICD-10-CM | POA: Diagnosis not present

## 2016-12-04 DIAGNOSIS — S79912A Unspecified injury of left hip, initial encounter: Secondary | ICD-10-CM | POA: Diagnosis not present

## 2016-12-04 DIAGNOSIS — S299XXA Unspecified injury of thorax, initial encounter: Secondary | ICD-10-CM | POA: Diagnosis not present

## 2016-12-04 DIAGNOSIS — R259 Unspecified abnormal involuntary movements: Secondary | ICD-10-CM | POA: Diagnosis not present

## 2016-12-04 DIAGNOSIS — S32599A Other specified fracture of unspecified pubis, initial encounter for closed fracture: Secondary | ICD-10-CM | POA: Diagnosis not present

## 2016-12-04 DIAGNOSIS — S70922A Unspecified superficial injury of left thigh, initial encounter: Secondary | ICD-10-CM | POA: Diagnosis not present

## 2016-12-04 DIAGNOSIS — S3993XA Unspecified injury of pelvis, initial encounter: Secondary | ICD-10-CM | POA: Diagnosis not present

## 2016-12-04 DIAGNOSIS — R102 Pelvic and perineal pain: Secondary | ICD-10-CM | POA: Diagnosis not present

## 2016-12-05 DIAGNOSIS — S3993XA Unspecified injury of pelvis, initial encounter: Secondary | ICD-10-CM | POA: Diagnosis not present

## 2016-12-05 DIAGNOSIS — R102 Pelvic and perineal pain: Secondary | ICD-10-CM | POA: Diagnosis not present

## 2016-12-05 DIAGNOSIS — F0390 Unspecified dementia without behavioral disturbance: Secondary | ICD-10-CM | POA: Diagnosis not present

## 2016-12-05 DIAGNOSIS — I119 Hypertensive heart disease without heart failure: Secondary | ICD-10-CM | POA: Diagnosis not present

## 2016-12-05 DIAGNOSIS — I10 Essential (primary) hypertension: Secondary | ICD-10-CM | POA: Diagnosis not present

## 2016-12-05 DIAGNOSIS — S32592A Other specified fracture of left pubis, initial encounter for closed fracture: Secondary | ICD-10-CM | POA: Diagnosis not present

## 2016-12-05 DIAGNOSIS — R262 Difficulty in walking, not elsewhere classified: Secondary | ICD-10-CM | POA: Diagnosis not present

## 2016-12-05 DIAGNOSIS — R531 Weakness: Secondary | ICD-10-CM | POA: Diagnosis not present

## 2016-12-05 DIAGNOSIS — M25552 Pain in left hip: Secondary | ICD-10-CM | POA: Diagnosis not present

## 2016-12-05 DIAGNOSIS — S32509D Unspecified fracture of unspecified pubis, subsequent encounter for fracture with routine healing: Secondary | ICD-10-CM | POA: Diagnosis not present

## 2016-12-05 DIAGNOSIS — E785 Hyperlipidemia, unspecified: Secondary | ICD-10-CM | POA: Diagnosis not present

## 2016-12-05 DIAGNOSIS — S32599A Other specified fracture of unspecified pubis, initial encounter for closed fracture: Secondary | ICD-10-CM | POA: Diagnosis not present

## 2016-12-05 DIAGNOSIS — S79912A Unspecified injury of left hip, initial encounter: Secondary | ICD-10-CM | POA: Diagnosis not present

## 2016-12-05 DIAGNOSIS — M439 Deforming dorsopathy, unspecified: Secondary | ICD-10-CM | POA: Diagnosis not present

## 2016-12-05 DIAGNOSIS — S299XXA Unspecified injury of thorax, initial encounter: Secondary | ICD-10-CM | POA: Diagnosis not present

## 2016-12-05 DIAGNOSIS — W19XXXD Unspecified fall, subsequent encounter: Secondary | ICD-10-CM | POA: Diagnosis not present

## 2016-12-05 DIAGNOSIS — S70922A Unspecified superficial injury of left thigh, initial encounter: Secondary | ICD-10-CM | POA: Diagnosis not present

## 2016-12-05 DIAGNOSIS — S329XXA Fracture of unspecified parts of lumbosacral spine and pelvis, initial encounter for closed fracture: Secondary | ICD-10-CM | POA: Diagnosis not present

## 2016-12-08 ENCOUNTER — Ambulatory Visit: Payer: Medicare Other | Admitting: Cardiovascular Disease

## 2016-12-09 DIAGNOSIS — M439 Deforming dorsopathy, unspecified: Secondary | ICD-10-CM | POA: Diagnosis not present

## 2016-12-09 DIAGNOSIS — R262 Difficulty in walking, not elsewhere classified: Secondary | ICD-10-CM | POA: Diagnosis not present

## 2016-12-09 DIAGNOSIS — S32509D Unspecified fracture of unspecified pubis, subsequent encounter for fracture with routine healing: Secondary | ICD-10-CM | POA: Diagnosis not present

## 2016-12-09 DIAGNOSIS — I119 Hypertensive heart disease without heart failure: Secondary | ICD-10-CM | POA: Diagnosis not present

## 2016-12-12 DIAGNOSIS — S32592A Other specified fracture of left pubis, initial encounter for closed fracture: Secondary | ICD-10-CM | POA: Diagnosis not present

## 2016-12-14 ENCOUNTER — Other Ambulatory Visit: Payer: Self-pay | Admitting: Cardiovascular Disease

## 2016-12-25 DIAGNOSIS — Z9181 History of falling: Secondary | ICD-10-CM | POA: Diagnosis not present

## 2016-12-25 DIAGNOSIS — I10 Essential (primary) hypertension: Secondary | ICD-10-CM | POA: Diagnosis not present

## 2016-12-25 DIAGNOSIS — Z7901 Long term (current) use of anticoagulants: Secondary | ICD-10-CM | POA: Diagnosis not present

## 2016-12-25 DIAGNOSIS — M6281 Muscle weakness (generalized): Secondary | ICD-10-CM | POA: Diagnosis not present

## 2016-12-25 DIAGNOSIS — F0390 Unspecified dementia without behavioral disturbance: Secondary | ICD-10-CM | POA: Diagnosis not present

## 2016-12-25 DIAGNOSIS — S32592D Other specified fracture of left pubis, subsequent encounter for fracture with routine healing: Secondary | ICD-10-CM | POA: Diagnosis not present

## 2016-12-25 DIAGNOSIS — I4891 Unspecified atrial fibrillation: Secondary | ICD-10-CM | POA: Diagnosis not present

## 2016-12-26 ENCOUNTER — Telehealth: Payer: Self-pay | Admitting: *Deleted

## 2016-12-26 NOTE — Telephone Encounter (Signed)
Received [facility] from [facility] stating pt was on Losartan and Valsartan. We only have losartan on pt's med list.   I [facility] [place] and they do not have Valsartan on list.  I spoke with pt's husband who confirms pt is not taking Valsartan. She is taking losartan and husband aware she should continue.  Paperwork completed and will be faxed back to [facility].

## 2016-12-27 ENCOUNTER — Other Ambulatory Visit: Payer: Self-pay | Admitting: Cardiovascular Disease

## 2016-12-27 DIAGNOSIS — S32592D Other specified fracture of left pubis, subsequent encounter for fracture with routine healing: Secondary | ICD-10-CM | POA: Diagnosis not present

## 2016-12-27 DIAGNOSIS — I4891 Unspecified atrial fibrillation: Secondary | ICD-10-CM | POA: Diagnosis not present

## 2016-12-27 DIAGNOSIS — I10 Essential (primary) hypertension: Secondary | ICD-10-CM | POA: Diagnosis not present

## 2016-12-27 DIAGNOSIS — Z7901 Long term (current) use of anticoagulants: Secondary | ICD-10-CM | POA: Diagnosis not present

## 2016-12-27 DIAGNOSIS — M6281 Muscle weakness (generalized): Secondary | ICD-10-CM | POA: Diagnosis not present

## 2016-12-27 DIAGNOSIS — F0390 Unspecified dementia without behavioral disturbance: Secondary | ICD-10-CM | POA: Diagnosis not present

## 2016-12-28 DIAGNOSIS — Z6824 Body mass index (BMI) 24.0-24.9, adult: Secondary | ICD-10-CM | POA: Diagnosis not present

## 2016-12-28 DIAGNOSIS — I10 Essential (primary) hypertension: Secondary | ICD-10-CM | POA: Diagnosis not present

## 2016-12-28 DIAGNOSIS — R791 Abnormal coagulation profile: Secondary | ICD-10-CM | POA: Diagnosis not present

## 2016-12-28 DIAGNOSIS — S32592A Other specified fracture of left pubis, initial encounter for closed fracture: Secondary | ICD-10-CM | POA: Diagnosis not present

## 2016-12-28 DIAGNOSIS — F0150 Vascular dementia without behavioral disturbance: Secondary | ICD-10-CM | POA: Diagnosis not present

## 2016-12-28 DIAGNOSIS — I4891 Unspecified atrial fibrillation: Secondary | ICD-10-CM | POA: Diagnosis not present

## 2016-12-28 DIAGNOSIS — M25552 Pain in left hip: Secondary | ICD-10-CM | POA: Diagnosis not present

## 2016-12-28 NOTE — Telephone Encounter (Signed)
Medication Detail    Disp Refills Start End   metoprolol (LOPRESSOR) 100 MG tablet 180 tablet 0 12/15/2016    Sig - Route: Take 1 tablet (100 mg total) by mouth 2 (two) times daily. - Oral   E-Prescribing Status: Receipt confirmed by pharmacy (12/15/2016 9:46 AM EDT)   Pharmacy   [id number] [place], [place]

## 2016-12-29 DIAGNOSIS — S32592D Other specified fracture of left pubis, subsequent encounter for fracture with routine healing: Secondary | ICD-10-CM | POA: Diagnosis not present

## 2016-12-29 DIAGNOSIS — M6281 Muscle weakness (generalized): Secondary | ICD-10-CM | POA: Diagnosis not present

## 2016-12-29 DIAGNOSIS — Z7901 Long term (current) use of anticoagulants: Secondary | ICD-10-CM | POA: Diagnosis not present

## 2016-12-29 DIAGNOSIS — I10 Essential (primary) hypertension: Secondary | ICD-10-CM | POA: Diagnosis not present

## 2016-12-29 DIAGNOSIS — I4891 Unspecified atrial fibrillation: Secondary | ICD-10-CM | POA: Diagnosis not present

## 2016-12-29 DIAGNOSIS — F0390 Unspecified dementia without behavioral disturbance: Secondary | ICD-10-CM | POA: Diagnosis not present

## 2017-01-02 DIAGNOSIS — I4891 Unspecified atrial fibrillation: Secondary | ICD-10-CM | POA: Diagnosis not present

## 2017-01-02 DIAGNOSIS — F0390 Unspecified dementia without behavioral disturbance: Secondary | ICD-10-CM | POA: Diagnosis not present

## 2017-01-02 DIAGNOSIS — S32592D Other specified fracture of left pubis, subsequent encounter for fracture with routine healing: Secondary | ICD-10-CM | POA: Diagnosis not present

## 2017-01-02 DIAGNOSIS — I10 Essential (primary) hypertension: Secondary | ICD-10-CM | POA: Diagnosis not present

## 2017-01-02 DIAGNOSIS — Z7901 Long term (current) use of anticoagulants: Secondary | ICD-10-CM | POA: Diagnosis not present

## 2017-01-02 DIAGNOSIS — M6281 Muscle weakness (generalized): Secondary | ICD-10-CM | POA: Diagnosis not present

## 2017-01-04 DIAGNOSIS — S32592D Other specified fracture of left pubis, subsequent encounter for fracture with routine healing: Secondary | ICD-10-CM | POA: Diagnosis not present

## 2017-01-04 DIAGNOSIS — F0390 Unspecified dementia without behavioral disturbance: Secondary | ICD-10-CM | POA: Diagnosis not present

## 2017-01-04 DIAGNOSIS — M6281 Muscle weakness (generalized): Secondary | ICD-10-CM | POA: Diagnosis not present

## 2017-01-04 DIAGNOSIS — I4891 Unspecified atrial fibrillation: Secondary | ICD-10-CM | POA: Diagnosis not present

## 2017-01-04 DIAGNOSIS — R791 Abnormal coagulation profile: Secondary | ICD-10-CM | POA: Diagnosis not present

## 2017-01-04 DIAGNOSIS — Z7901 Long term (current) use of anticoagulants: Secondary | ICD-10-CM | POA: Diagnosis not present

## 2017-01-04 DIAGNOSIS — I10 Essential (primary) hypertension: Secondary | ICD-10-CM | POA: Diagnosis not present

## 2017-01-05 DIAGNOSIS — S32592D Other specified fracture of left pubis, subsequent encounter for fracture with routine healing: Secondary | ICD-10-CM | POA: Diagnosis not present

## 2017-01-05 DIAGNOSIS — I4891 Unspecified atrial fibrillation: Secondary | ICD-10-CM | POA: Diagnosis not present

## 2017-01-05 DIAGNOSIS — F0390 Unspecified dementia without behavioral disturbance: Secondary | ICD-10-CM | POA: Diagnosis not present

## 2017-01-05 DIAGNOSIS — M6281 Muscle weakness (generalized): Secondary | ICD-10-CM | POA: Diagnosis not present

## 2017-01-05 DIAGNOSIS — I10 Essential (primary) hypertension: Secondary | ICD-10-CM | POA: Diagnosis not present

## 2017-01-05 DIAGNOSIS — Z7901 Long term (current) use of anticoagulants: Secondary | ICD-10-CM | POA: Diagnosis not present

## 2017-01-09 DIAGNOSIS — Z7901 Long term (current) use of anticoagulants: Secondary | ICD-10-CM | POA: Diagnosis not present

## 2017-01-09 DIAGNOSIS — M6281 Muscle weakness (generalized): Secondary | ICD-10-CM | POA: Diagnosis not present

## 2017-01-09 DIAGNOSIS — S32592D Other specified fracture of left pubis, subsequent encounter for fracture with routine healing: Secondary | ICD-10-CM | POA: Diagnosis not present

## 2017-01-09 DIAGNOSIS — I4891 Unspecified atrial fibrillation: Secondary | ICD-10-CM | POA: Diagnosis not present

## 2017-01-09 DIAGNOSIS — I10 Essential (primary) hypertension: Secondary | ICD-10-CM | POA: Diagnosis not present

## 2017-01-09 DIAGNOSIS — F0390 Unspecified dementia without behavioral disturbance: Secondary | ICD-10-CM | POA: Diagnosis not present

## 2017-01-11 DIAGNOSIS — S32592D Other specified fracture of left pubis, subsequent encounter for fracture with routine healing: Secondary | ICD-10-CM | POA: Diagnosis not present

## 2017-01-11 DIAGNOSIS — M810 Age-related osteoporosis without current pathological fracture: Secondary | ICD-10-CM | POA: Diagnosis not present

## 2017-01-11 DIAGNOSIS — Z7901 Long term (current) use of anticoagulants: Secondary | ICD-10-CM | POA: Diagnosis not present

## 2017-01-11 DIAGNOSIS — S32592A Other specified fracture of left pubis, initial encounter for closed fracture: Secondary | ICD-10-CM | POA: Diagnosis not present

## 2017-01-11 DIAGNOSIS — R791 Abnormal coagulation profile: Secondary | ICD-10-CM | POA: Diagnosis not present

## 2017-01-11 DIAGNOSIS — I10 Essential (primary) hypertension: Secondary | ICD-10-CM | POA: Diagnosis not present

## 2017-01-11 DIAGNOSIS — F0390 Unspecified dementia without behavioral disturbance: Secondary | ICD-10-CM | POA: Diagnosis not present

## 2017-01-11 DIAGNOSIS — I4891 Unspecified atrial fibrillation: Secondary | ICD-10-CM | POA: Diagnosis not present

## 2017-01-11 DIAGNOSIS — M6281 Muscle weakness (generalized): Secondary | ICD-10-CM | POA: Diagnosis not present

## 2017-01-13 DIAGNOSIS — Z7901 Long term (current) use of anticoagulants: Secondary | ICD-10-CM | POA: Diagnosis not present

## 2017-01-13 DIAGNOSIS — S32592D Other specified fracture of left pubis, subsequent encounter for fracture with routine healing: Secondary | ICD-10-CM | POA: Diagnosis not present

## 2017-01-13 DIAGNOSIS — M6281 Muscle weakness (generalized): Secondary | ICD-10-CM | POA: Diagnosis not present

## 2017-01-13 DIAGNOSIS — I4891 Unspecified atrial fibrillation: Secondary | ICD-10-CM | POA: Diagnosis not present

## 2017-01-13 DIAGNOSIS — F0390 Unspecified dementia without behavioral disturbance: Secondary | ICD-10-CM | POA: Diagnosis not present

## 2017-01-13 DIAGNOSIS — I10 Essential (primary) hypertension: Secondary | ICD-10-CM | POA: Diagnosis not present

## 2017-01-17 DIAGNOSIS — S32592D Other specified fracture of left pubis, subsequent encounter for fracture with routine healing: Secondary | ICD-10-CM | POA: Diagnosis not present

## 2017-01-17 DIAGNOSIS — I10 Essential (primary) hypertension: Secondary | ICD-10-CM | POA: Diagnosis not present

## 2017-01-17 DIAGNOSIS — M6281 Muscle weakness (generalized): Secondary | ICD-10-CM | POA: Diagnosis not present

## 2017-01-17 DIAGNOSIS — I4891 Unspecified atrial fibrillation: Secondary | ICD-10-CM | POA: Diagnosis not present

## 2017-01-17 DIAGNOSIS — Z7901 Long term (current) use of anticoagulants: Secondary | ICD-10-CM | POA: Diagnosis not present

## 2017-01-17 DIAGNOSIS — F0390 Unspecified dementia without behavioral disturbance: Secondary | ICD-10-CM | POA: Diagnosis not present

## 2017-01-18 DIAGNOSIS — M6281 Muscle weakness (generalized): Secondary | ICD-10-CM | POA: Diagnosis not present

## 2017-01-18 DIAGNOSIS — I10 Essential (primary) hypertension: Secondary | ICD-10-CM | POA: Diagnosis not present

## 2017-01-18 DIAGNOSIS — I4891 Unspecified atrial fibrillation: Secondary | ICD-10-CM | POA: Diagnosis not present

## 2017-01-18 DIAGNOSIS — R791 Abnormal coagulation profile: Secondary | ICD-10-CM | POA: Diagnosis not present

## 2017-01-18 DIAGNOSIS — S32592D Other specified fracture of left pubis, subsequent encounter for fracture with routine healing: Secondary | ICD-10-CM | POA: Diagnosis not present

## 2017-01-18 DIAGNOSIS — Z7901 Long term (current) use of anticoagulants: Secondary | ICD-10-CM | POA: Diagnosis not present

## 2017-01-18 DIAGNOSIS — F0390 Unspecified dementia without behavioral disturbance: Secondary | ICD-10-CM | POA: Diagnosis not present

## 2017-01-20 ENCOUNTER — Encounter: Payer: Self-pay | Admitting: Cardiovascular Disease

## 2017-01-20 DIAGNOSIS — I10 Essential (primary) hypertension: Secondary | ICD-10-CM | POA: Diagnosis not present

## 2017-01-20 DIAGNOSIS — F0390 Unspecified dementia without behavioral disturbance: Secondary | ICD-10-CM | POA: Diagnosis not present

## 2017-01-20 DIAGNOSIS — Z7901 Long term (current) use of anticoagulants: Secondary | ICD-10-CM | POA: Diagnosis not present

## 2017-01-20 DIAGNOSIS — M6281 Muscle weakness (generalized): Secondary | ICD-10-CM | POA: Diagnosis not present

## 2017-01-20 DIAGNOSIS — I4891 Unspecified atrial fibrillation: Secondary | ICD-10-CM | POA: Diagnosis not present

## 2017-01-20 DIAGNOSIS — S32592D Other specified fracture of left pubis, subsequent encounter for fracture with routine healing: Secondary | ICD-10-CM | POA: Diagnosis not present

## 2017-01-23 DIAGNOSIS — S32592A Other specified fracture of left pubis, initial encounter for closed fracture: Secondary | ICD-10-CM | POA: Diagnosis not present

## 2017-01-24 DIAGNOSIS — I10 Essential (primary) hypertension: Secondary | ICD-10-CM | POA: Diagnosis not present

## 2017-01-24 DIAGNOSIS — S32592D Other specified fracture of left pubis, subsequent encounter for fracture with routine healing: Secondary | ICD-10-CM | POA: Diagnosis not present

## 2017-01-24 DIAGNOSIS — Z7901 Long term (current) use of anticoagulants: Secondary | ICD-10-CM | POA: Diagnosis not present

## 2017-01-24 DIAGNOSIS — F0390 Unspecified dementia without behavioral disturbance: Secondary | ICD-10-CM | POA: Diagnosis not present

## 2017-01-24 DIAGNOSIS — M6281 Muscle weakness (generalized): Secondary | ICD-10-CM | POA: Diagnosis not present

## 2017-01-24 DIAGNOSIS — I4891 Unspecified atrial fibrillation: Secondary | ICD-10-CM | POA: Diagnosis not present

## 2017-01-25 DIAGNOSIS — R791 Abnormal coagulation profile: Secondary | ICD-10-CM | POA: Diagnosis not present

## 2017-01-26 DIAGNOSIS — S32592D Other specified fracture of left pubis, subsequent encounter for fracture with routine healing: Secondary | ICD-10-CM | POA: Diagnosis not present

## 2017-01-26 DIAGNOSIS — F0390 Unspecified dementia without behavioral disturbance: Secondary | ICD-10-CM | POA: Diagnosis not present

## 2017-01-26 DIAGNOSIS — I10 Essential (primary) hypertension: Secondary | ICD-10-CM | POA: Diagnosis not present

## 2017-01-26 DIAGNOSIS — Z7901 Long term (current) use of anticoagulants: Secondary | ICD-10-CM | POA: Diagnosis not present

## 2017-01-26 DIAGNOSIS — M6281 Muscle weakness (generalized): Secondary | ICD-10-CM | POA: Diagnosis not present

## 2017-01-26 DIAGNOSIS — I4891 Unspecified atrial fibrillation: Secondary | ICD-10-CM | POA: Diagnosis not present

## 2017-01-30 DIAGNOSIS — S32592D Other specified fracture of left pubis, subsequent encounter for fracture with routine healing: Secondary | ICD-10-CM | POA: Diagnosis not present

## 2017-01-30 DIAGNOSIS — I10 Essential (primary) hypertension: Secondary | ICD-10-CM | POA: Diagnosis not present

## 2017-01-30 DIAGNOSIS — F0390 Unspecified dementia without behavioral disturbance: Secondary | ICD-10-CM | POA: Diagnosis not present

## 2017-01-30 DIAGNOSIS — I4891 Unspecified atrial fibrillation: Secondary | ICD-10-CM | POA: Diagnosis not present

## 2017-01-30 DIAGNOSIS — M6281 Muscle weakness (generalized): Secondary | ICD-10-CM | POA: Diagnosis not present

## 2017-01-30 DIAGNOSIS — Z7901 Long term (current) use of anticoagulants: Secondary | ICD-10-CM | POA: Diagnosis not present

## 2017-02-01 DIAGNOSIS — R791 Abnormal coagulation profile: Secondary | ICD-10-CM | POA: Diagnosis not present

## 2017-02-02 DIAGNOSIS — I4891 Unspecified atrial fibrillation: Secondary | ICD-10-CM | POA: Diagnosis not present

## 2017-02-02 DIAGNOSIS — S32592D Other specified fracture of left pubis, subsequent encounter for fracture with routine healing: Secondary | ICD-10-CM | POA: Diagnosis not present

## 2017-02-02 DIAGNOSIS — M6281 Muscle weakness (generalized): Secondary | ICD-10-CM | POA: Diagnosis not present

## 2017-02-02 DIAGNOSIS — I10 Essential (primary) hypertension: Secondary | ICD-10-CM | POA: Diagnosis not present

## 2017-02-02 DIAGNOSIS — Z7901 Long term (current) use of anticoagulants: Secondary | ICD-10-CM | POA: Diagnosis not present

## 2017-02-02 DIAGNOSIS — F0390 Unspecified dementia without behavioral disturbance: Secondary | ICD-10-CM | POA: Diagnosis not present

## 2017-02-06 DIAGNOSIS — Z7901 Long term (current) use of anticoagulants: Secondary | ICD-10-CM | POA: Diagnosis not present

## 2017-02-06 DIAGNOSIS — I4891 Unspecified atrial fibrillation: Secondary | ICD-10-CM | POA: Diagnosis not present

## 2017-02-06 DIAGNOSIS — S32592D Other specified fracture of left pubis, subsequent encounter for fracture with routine healing: Secondary | ICD-10-CM | POA: Diagnosis not present

## 2017-02-06 DIAGNOSIS — M6281 Muscle weakness (generalized): Secondary | ICD-10-CM | POA: Diagnosis not present

## 2017-02-06 DIAGNOSIS — I10 Essential (primary) hypertension: Secondary | ICD-10-CM | POA: Diagnosis not present

## 2017-02-06 DIAGNOSIS — F0390 Unspecified dementia without behavioral disturbance: Secondary | ICD-10-CM | POA: Diagnosis not present

## 2017-02-07 DIAGNOSIS — F0390 Unspecified dementia without behavioral disturbance: Secondary | ICD-10-CM | POA: Diagnosis not present

## 2017-02-07 DIAGNOSIS — I10 Essential (primary) hypertension: Secondary | ICD-10-CM | POA: Diagnosis not present

## 2017-02-07 DIAGNOSIS — M6281 Muscle weakness (generalized): Secondary | ICD-10-CM | POA: Diagnosis not present

## 2017-02-07 DIAGNOSIS — Z7901 Long term (current) use of anticoagulants: Secondary | ICD-10-CM | POA: Diagnosis not present

## 2017-02-07 DIAGNOSIS — S32592D Other specified fracture of left pubis, subsequent encounter for fracture with routine healing: Secondary | ICD-10-CM | POA: Diagnosis not present

## 2017-02-07 DIAGNOSIS — I4891 Unspecified atrial fibrillation: Secondary | ICD-10-CM | POA: Diagnosis not present

## 2017-02-08 DIAGNOSIS — R791 Abnormal coagulation profile: Secondary | ICD-10-CM | POA: Diagnosis not present

## 2017-02-13 ENCOUNTER — Encounter: Payer: Self-pay | Admitting: Cardiovascular Disease

## 2017-02-13 ENCOUNTER — Ambulatory Visit (INDEPENDENT_AMBULATORY_CARE_PROVIDER_SITE_OTHER): Payer: Medicare Other | Admitting: Cardiovascular Disease

## 2017-02-13 VITALS — BP 110/68 | HR 85 | Ht 64.0 in | Wt 131.4 lb

## 2017-02-13 DIAGNOSIS — I10 Essential (primary) hypertension: Secondary | ICD-10-CM

## 2017-02-13 DIAGNOSIS — I351 Nonrheumatic aortic (valve) insufficiency: Secondary | ICD-10-CM

## 2017-02-13 DIAGNOSIS — I481 Persistent atrial fibrillation: Secondary | ICD-10-CM | POA: Diagnosis not present

## 2017-02-13 DIAGNOSIS — I4819 Other persistent atrial fibrillation: Secondary | ICD-10-CM

## 2017-02-13 NOTE — Patient Instructions (Signed)

## 2017-02-13 NOTE — Progress Notes (Signed)
Chief Complaint  Patient presents with  . Follow-up    atrial fibrillation     History of Present Illness: 83 yo female with persistent atrial fibrillation, HTN, HLD, dementia and aortic valve insufficiency who is here today for cardiac follow up. She has persistent atrial fibrillation and is on Lopressor and coumadin. Last echo in 2011 with normal LV function and mild AI.  No repeat cardiac testing due to advanced age and dementia. Her dementia is worsening. I have reviewed the recent Neurology notes which confirms this by testing.   She is here today for follow up. The patient denies any chest pain, dyspnea, palpitations, lower extremity edema, orthopnea, PND, dizziness, near syncope or syncope.   Primary Care Physician: Nicoletta Dress, MD  Past Medical History:  Diagnosis Date  . Chronic atrial fibrillation (HCC)    echo 9/09 EF 60%, mild MR  . Cognitive deficits 08/07/2013  . Diabetes mellitus   . HTN (hypertension)   . Hyperlipidemia     Past Surgical History:  Procedure Laterality Date  . APPENDECTOMY    . SHOULDER SURGERY     Bilateral    Current Outpatient Prescriptions  Medication Sig Dispense Refill  . atorvastatin (LIPITOR) 40 MG tablet Take 40 mg by mouth at bedtime.  1  . benzonatate (TESSALON) 200 MG capsule Take 200 mg by mouth 3 (three) times daily as needed for cough.    . donepezil (ARICEPT) 5 MG tablet Take 1 tablet (5 mg total) by mouth at bedtime. 90 tablet 3  . fish oil-omega-3 fatty acids 1000 MG capsule Take 1 g by mouth daily.      . furosemide (LASIX) 40 MG tablet Take 1 tablet (40 mg total) by mouth daily. 90 tablet 2  . KLOR-CON M20 20 MEQ tablet TAKE 2 TABLETS BY MOUTH EVERY DAY 180 tablet 3  . losartan (COZAAR) 25 MG tablet TAKE 1 TABLET BY MOUTH DAILY 90 tablet 1  . memantine (NAMENDA) 10 MG tablet Take 1 tablet (10 mg total) by mouth 2 (two) times daily. 180 tablet 3  . metoprolol (LOPRESSOR) 100 MG tablet Take 1 tablet (100 mg total) by  mouth 2 (two) times daily. 180 tablet 0  . warfarin (COUMADIN) 5 MG tablet Take 5 mg by mouth daily. Use as directed by Anticoagulation clinic.      No current facility-administered medications for this visit.     Allergies  Allergen Reactions  . Aspirin     PT UNSURE OF REACTION    Social History   Social History  . Marital status: Married    Spouse name: Micheal  . Number of children: 3  . Years of education: [name]   Occupational History  . Retired-part time in doctors offices Retired   Social History Main Topics  . Smoking status: Former Smoker    Packs/day: 0.20    Years: 40.00    Types: Cigarettes    Quit date: 10/03/1982  . Smokeless tobacco: Never Used  . Alcohol use No  . Drug use: No  . Sexual activity: Not on file   Other Topics Concern  . Not on file   Social History Narrative   Married, exercises regularly. Her PCP is Dr. Truman Hayward, cardiologist is Dr. Tempie Hoist. She lives independently in a private home, is married.     Her husband is here with her.    The patient is 7, born with Erb's Palsy, has three adult children, two daughters an   d  a son, 37 years younger than his sisters. The patient drinks caffeinated beverages, is a non-smoker, non-drinker, handedness: ambidexterous    Family History  Problem Relation Age of Onset  . Cancer Mother   . Cancer Father   . Cancer Unknown        family history of it.     Review of Systems:  As stated in the HPI and otherwise negative.   BP 110/68   Pulse 85   Ht 5\' 4"  (1.626 m)   Wt 131 lb 6.4 oz (59.6 kg)   SpO2 98%   BMI 22.55 kg/m   Physical Examination:  General: Well developed, well nourished, NAD  HEENT: OP clear, mucus membranes moist  SKIN: warm, dry. No rashes. Neuro: No focal deficits  Musculoskeletal: Muscle strength 5/5 all ext  Psychiatric: Mood and affect normal  Neck: No JVD, no carotid bruits, no thyromegaly, no lymphadenopathy.  Lungs:Clear bilaterally, no wheezes, rhonci,  crackles Cardiovascular: Irreg irreg with soft murmur noted.  Abdomen:Soft. Bowel sounds present. Non-tender.  Extremities: No lower extremity edema. Pulses are 2 + in the bilateral DP/PT.  EKG:  EKG is ordered today. The ekg ordered today demonstrates atrial fibrillation, rate 69 bpm.   Recent Labs: No results found for requested labs within last 8760 hours.   Lipid Panel Followed in primary care   Wt Readings from Last 3 Encounters:  02/13/17 131 lb 6.4 oz (59.6 kg)  09/20/16 139 lb (63 kg)  05/05/16 143 lb 9.6 oz (65.1 kg)     Other studies Reviewed: Additional studies/ records that were reviewed today include: . Review of the above records demonstrates:    Assessment and Plan:   1. Atrial fibrillation, persistent: She is in atrial fib today. Rate on controlled on Lopressor. She is on coumadin. No bleeding issues. No changes today.    2. HYPERTENSION: BP controlled. No changes today.   3. Aortic insufficiency: Mild by echo in 2011. No loud murmur on exam. NO plans for repeat echo given advanced age and dementia.    Current medicines are reviewed at length with the patient today.  The patient does not have concerns regarding medicines.  The following changes have been made:    Labs/ tests ordered today include:   Orders Placed This Encounter  Procedures  . EKG 12-Lead    Disposition:   FU with me in 6 months  Signed, Lauree Chandler, MD 02/13/2017 1:54 PM    [facility] [place], [place], [place]  [address] Phone: [phone]; Fax: [phone]

## 2017-02-16 DIAGNOSIS — E785 Hyperlipidemia, unspecified: Secondary | ICD-10-CM | POA: Diagnosis not present

## 2017-02-16 DIAGNOSIS — Z6824 Body mass index (BMI) 24.0-24.9, adult: Secondary | ICD-10-CM | POA: Diagnosis not present

## 2017-02-16 DIAGNOSIS — I4891 Unspecified atrial fibrillation: Secondary | ICD-10-CM | POA: Diagnosis not present

## 2017-02-16 DIAGNOSIS — R7301 Impaired fasting glucose: Secondary | ICD-10-CM | POA: Diagnosis not present

## 2017-02-16 DIAGNOSIS — I10 Essential (primary) hypertension: Secondary | ICD-10-CM | POA: Diagnosis not present

## 2017-02-16 DIAGNOSIS — F0150 Vascular dementia without behavioral disturbance: Secondary | ICD-10-CM | POA: Diagnosis not present

## 2017-02-16 DIAGNOSIS — Z79899 Other long term (current) drug therapy: Secondary | ICD-10-CM | POA: Diagnosis not present

## 2017-02-20 DIAGNOSIS — S32592A Other specified fracture of left pubis, initial encounter for closed fracture: Secondary | ICD-10-CM | POA: Diagnosis not present

## 2017-02-22 DIAGNOSIS — R791 Abnormal coagulation profile: Secondary | ICD-10-CM | POA: Diagnosis not present

## 2017-03-01 DIAGNOSIS — R791 Abnormal coagulation profile: Secondary | ICD-10-CM | POA: Diagnosis not present

## 2017-03-15 DIAGNOSIS — R791 Abnormal coagulation profile: Secondary | ICD-10-CM | POA: Diagnosis not present

## 2017-03-21 ENCOUNTER — Encounter: Payer: Self-pay | Admitting: Adult Health

## 2017-03-21 ENCOUNTER — Ambulatory Visit (INDEPENDENT_AMBULATORY_CARE_PROVIDER_SITE_OTHER): Payer: Medicare Other | Admitting: Adult Health

## 2017-03-21 VITALS — BP 93/58 | HR 71 | Wt 132.2 lb

## 2017-03-21 DIAGNOSIS — R413 Other amnesia: Secondary | ICD-10-CM | POA: Diagnosis not present

## 2017-03-21 NOTE — Progress Notes (Signed)
PATIENT: [name] DOB: 09-Jul-1925  REASON FOR VISIT: follow up-memory HISTORY FROM: patient  HISTORY OF PRESENT ILLNESS:   [name] is a 83 year old female with a history of memory disturbance. She returns today for follow-up. She is only taking Namenda and Aricept. She reports that she feels her memory might have gotten slightly worse. She was at home with her husband of 62 years. She is able to complete all ADLs independently. She no longer does any household chores. Reports that her husband handles all the finances. They both participate in cooking. She denies any difficulty preparing meals. She states that she's never been a good sleeper. She tends to sleep the best in her chair. Denies hallucinations. Denies any changes with her mood or behavior. She denies any significant depression. She returns today for an evaluation.  HISTORY 03/24/15: [name] is an 83 year old female with a history of memory loss. She returns today for follow-up. She is currently taking Namenda and Aricept and tolerating it well. The patient denies having to give up anything due to her memory. She feels that her memory has remained the same. She is able to complete all ADLs independently. Patient states that she is able to drive but typically lets her husband. The patient remains very social. Her and her husband go out every morning for breakfast with friends. She denies any new medical issues. Returns today for evaluation  REVIEW OF SYSTEMS: Out of a complete 14 system review of symptoms, the patient complains only of the following symptoms, and all other reviewed systems are negative.  Walking difficulty, cold intolerance  ALLERGIES: Allergies  Allergen Reactions  . Aspirin     PT UNSURE OF REACTION    HOME MEDICATIONS: Outpatient Medications Prior to Visit  Medication Sig Dispense Refill  . atorvastatin (LIPITOR) 40 MG tablet Take 40 mg by mouth at bedtime.  1  . benzonatate (TESSALON) 200 MG  capsule Take 200 mg by mouth 3 (three) times daily as needed for cough.    . donepezil (ARICEPT) 5 MG tablet Take 1 tablet (5 mg total) by mouth at bedtime. 90 tablet 3  . fish oil-omega-3 fatty acids 1000 MG capsule Take 1 g by mouth daily.      . furosemide (LASIX) 40 MG tablet Take 1 tablet (40 mg total) by mouth daily. 90 tablet 2  . KLOR-CON M20 20 MEQ tablet TAKE 2 TABLETS BY MOUTH EVERY DAY 180 tablet 3  . losartan (COZAAR) 25 MG tablet TAKE 1 TABLET BY MOUTH DAILY 90 tablet 1  . memantine (NAMENDA) 10 MG tablet Take 1 tablet (10 mg total) by mouth 2 (two) times daily. 180 tablet 3  . metoprolol (LOPRESSOR) 100 MG tablet Take 1 tablet (100 mg total) by mouth 2 (two) times daily. 180 tablet 0  . warfarin (COUMADIN) 5 MG tablet Take 5 mg by mouth daily. Use as directed by Anticoagulation clinic.      No facility-administered medications prior to visit.     PAST MEDICAL HISTORY: Past Medical History:  Diagnosis Date  . Chronic atrial fibrillation (HCC)    echo 9/09 EF 60%, mild MR  . Cognitive deficits 08/07/2013  . Diabetes mellitus   . HTN (hypertension)   . Hyperlipidemia     PAST SURGICAL HISTORY: Past Surgical History:  Procedure Laterality Date  . APPENDECTOMY    . SHOULDER SURGERY     Bilateral    FAMILY HISTORY: Family History  Problem Relation Age of Onset  .  Cancer Mother   . Cancer Father   . Cancer Unknown        family history of it.     SOCIAL HISTORY: Social History   Social History  . Marital status: Married    Spouse name: [name]  . Number of children: 3  . Years of education: [name]   Occupational History  . Retired-part time in doctors offices Retired   Social History Main Topics  . Smoking status: Former Smoker    Packs/day: 0.20    Years: 40.00    Types: Cigarettes    Quit date: 10/03/1982  . Smokeless tobacco: Never Used  . Alcohol use No  . Drug use: No  . Sexual activity: Not on file   Other Topics Concern  . Not on file    Social History Narrative   Married, exercises regularly. Her PCP is Dr. Truman Hayward, cardiologist is Dr. Tempie Hoist. She lives independently in a private home, is married.     Her husband is here with her.    The patient is 9, born with Erb's Palsy, has three adult children, two daughters an   d a son, 8 years younger than his sisters. The patient drinks caffeinated beverages, is a non-smoker, non-drinker, handedness: ambidexterous      PHYSICAL EXAM  Vitals:   03/21/17 1321  BP: (!) 93/58  Pulse: 71  Weight: 132 lb 3.2 oz (60 kg)   Body mass index is 22.69 kg/m.  MMSE - Mini Mental State Exam 03/21/2017 09/20/2016 08/08/2014  Orientation to time 3 2 3   Orientation to Place 5 3 3   Registration 3 3 3   Attention/ Calculation 0 4 5  Attention/Calculation-comments - - dlrow  Recall 0 0 0  Language- name 2 objects 2 2 2   Language- repeat 1 1 1   Language- follow 3 step command 3 3 3   Language- read & follow direction 1 1 1   Write a sentence 1 1 1   Copy design 1 1 1   Total score 20 21 23      Generalized: Well developed, in no acute distress   Neurological examination  Mentation: Alert . Follows all commands speech and language fluent Cranial nerve II-XII: Pupils were equal round reactive to light. Extraocular movements were full, visual field were full on confrontational test. Facial sensation and strength were normal. Uvula tongue midline. Head turning and shoulder shrug  were normal and symmetric. Motor: The motor testing reveals 5 over 5 strength of all 4 extremities. Good symmetric motor tone is noted throughout. Limited range of motion in the right upper extremity due to shoulder injury. Sensory: Sensory testing is intact to soft touch on all 4 extremities. No evidence of extinction is noted.  Coordination: Cerebellar testing reveals good finger-nose-finger and heel-to-shin bilaterally.  Gait and station: Gait is slightly unsteady. She uses a walker when ambulating. Tandem gait  not attempted. Reflexes: Deep tendon reflexes are symmetric and normal bilaterally.   DIAGNOSTIC DATA (LABS, IMAGING, TESTING) - I reviewed patient records, labs, notes, testing and imaging myself where available.      Component Value Date/Time   NA 140 04/14/2011 1006   K 3.3 (L) 04/14/2011 1006   CL 99 04/14/2011 1006   CO2 26 04/14/2011 1006   GLUCOSE 128 (H) 04/14/2011 1006   BUN 23 04/14/2011 1006   CREATININE 1.4 (H) 04/14/2011 1006   CALCIUM 8.8 04/14/2011 1006       ASSESSMENT AND PLAN 83 y.o. year old female  has a past medical  history of Chronic atrial fibrillation ([place]); Cognitive deficits (08/07/2013); Diabetes mellitus; HTN (hypertension); and Hyperlipidemia. here with:  1. Memory disturbance  The patient's memory score has remained stable. She will continue on Aricept and Namenda. She is advised that if her symptoms worsen or she develops new symptoms she should let us know. She will follow-up in 6 months or sooner if needed.  I spent 15 minutes with the patient. 50% of this time was spent reviewing her memory testing.     Ward Givens, MSN, NP-C 03/21/2017, 1:35 PM [facility] [address], [place], [place] [address] [phone]

## 2017-03-21 NOTE — Patient Instructions (Signed)
Continue Aricept and namenda Memory score is stable  If your symptoms worsen or you develop new symptoms please let us know.

## 2017-03-22 NOTE — Progress Notes (Signed)
I have read the note, and I agree with the clinical assessment and plan.  Sarika Baldini A. Makailee Nudelman, MD, PhD, FAAN Certified in Neurology, Clinical Neurophysiology, Sleep Medicine, Pain Medicine and Neuroimaging  [facility] [address], [address] [place], [place] [address] [phone]  

## 2017-03-27 ENCOUNTER — Other Ambulatory Visit: Payer: Self-pay | Admitting: Cardiovascular Disease

## 2017-03-27 DIAGNOSIS — Z1389 Encounter for screening for other disorder: Secondary | ICD-10-CM | POA: Diagnosis not present

## 2017-03-27 DIAGNOSIS — Z0000 Encounter for general adult medical examination without abnormal findings: Secondary | ICD-10-CM | POA: Diagnosis not present

## 2017-03-27 DIAGNOSIS — E785 Hyperlipidemia, unspecified: Secondary | ICD-10-CM | POA: Diagnosis not present

## 2017-03-27 DIAGNOSIS — Z9181 History of falling: Secondary | ICD-10-CM | POA: Diagnosis not present

## 2017-03-27 DIAGNOSIS — Z136 Encounter for screening for cardiovascular disorders: Secondary | ICD-10-CM | POA: Diagnosis not present

## 2017-04-13 ENCOUNTER — Telehealth: Payer: Self-pay | Admitting: *Deleted

## 2017-04-13 NOTE — Telephone Encounter (Signed)
Aricept PA approved on cover my meds.

## 2017-04-17 DIAGNOSIS — Z7901 Long term (current) use of anticoagulants: Secondary | ICD-10-CM | POA: Diagnosis not present

## 2017-04-18 ENCOUNTER — Telehealth: Payer: Self-pay | Admitting: Neurology

## 2017-04-18 NOTE — Telephone Encounter (Signed)
[facility] has approved Donepezil for pt from 03/14/2017- 04/12/2020. PA# [id number]

## 2017-05-18 ENCOUNTER — Other Ambulatory Visit: Payer: Self-pay | Admitting: Cardiovascular Disease

## 2017-05-18 MED ORDER — POTASSIUM CHLORIDE CRYS ER 20 MEQ PO TBCR: 40.0000 meq | EXTENDED_RELEASE_TABLET | Freq: Every day | ORAL | 2 refills | Status: DC

## 2017-05-18 MED ORDER — LOSARTAN POTASSIUM 25 MG PO TABS: 25.0000 mg | ORAL_TABLET | Freq: Every day | ORAL | 2 refills | Status: DC

## 2017-05-23 DIAGNOSIS — Z7901 Long term (current) use of anticoagulants: Secondary | ICD-10-CM | POA: Diagnosis not present

## 2017-05-23 DIAGNOSIS — I10 Essential (primary) hypertension: Secondary | ICD-10-CM | POA: Diagnosis not present

## 2017-05-23 DIAGNOSIS — R7301 Impaired fasting glucose: Secondary | ICD-10-CM | POA: Diagnosis not present

## 2017-05-23 DIAGNOSIS — E785 Hyperlipidemia, unspecified: Secondary | ICD-10-CM | POA: Diagnosis not present

## 2017-05-23 DIAGNOSIS — I4891 Unspecified atrial fibrillation: Secondary | ICD-10-CM | POA: Diagnosis not present

## 2017-05-23 DIAGNOSIS — Z79899 Other long term (current) drug therapy: Secondary | ICD-10-CM | POA: Diagnosis not present

## 2017-05-23 DIAGNOSIS — F0150 Vascular dementia without behavioral disturbance: Secondary | ICD-10-CM | POA: Diagnosis not present

## 2017-05-24 DIAGNOSIS — R791 Abnormal coagulation profile: Secondary | ICD-10-CM | POA: Diagnosis not present

## 2017-05-26 DIAGNOSIS — R791 Abnormal coagulation profile: Secondary | ICD-10-CM | POA: Diagnosis not present

## 2017-06-02 DIAGNOSIS — R791 Abnormal coagulation profile: Secondary | ICD-10-CM | POA: Diagnosis not present

## 2017-06-16 DIAGNOSIS — Z7901 Long term (current) use of anticoagulants: Secondary | ICD-10-CM | POA: Diagnosis not present

## 2017-06-23 ENCOUNTER — Other Ambulatory Visit: Payer: Self-pay | Admitting: Cardiovascular Disease

## 2017-06-23 DIAGNOSIS — R791 Abnormal coagulation profile: Secondary | ICD-10-CM | POA: Diagnosis not present

## 2017-07-07 DIAGNOSIS — R791 Abnormal coagulation profile: Secondary | ICD-10-CM | POA: Diagnosis not present

## 2017-07-14 DIAGNOSIS — R791 Abnormal coagulation profile: Secondary | ICD-10-CM | POA: Diagnosis not present

## 2017-07-18 ENCOUNTER — Telehealth: Payer: Self-pay | Admitting: Cardiovascular Disease

## 2017-07-18 NOTE — Telephone Encounter (Signed)
Need name of medication quantity requested and pharmacy please. Thanks, MI

## 2017-07-18 NOTE — Telephone Encounter (Signed)
New message    Pt husband is calling about a prescription. He said that she needs her medication that is a big white pill. He said the pharmacy said they didn't have any refills but he knows they did. Please call.

## 2017-07-18 NOTE — Telephone Encounter (Signed)
The reason I didn't put the name is the pt husband did not know the name of her medication. I'm sorry I thought I had said that in the message.

## 2017-07-19 NOTE — Telephone Encounter (Signed)
Spoke with patients husband and he was requesting a refill on potassium for his wife. She actually has refills but he had accidentally been giving her more that she was prescribed. He stated that he spoke with the pharmacy and they informed him insurance will pay for the refill again in ten days. They pharmacist is going to supply them with enough medication to last until then per husband. He was appreciative for my call.

## 2017-07-21 DIAGNOSIS — R791 Abnormal coagulation profile: Secondary | ICD-10-CM | POA: Diagnosis not present

## 2017-08-14 DIAGNOSIS — Z23 Encounter for immunization: Secondary | ICD-10-CM | POA: Diagnosis not present

## 2017-08-28 DIAGNOSIS — F0150 Vascular dementia without behavioral disturbance: Secondary | ICD-10-CM | POA: Diagnosis not present

## 2017-08-28 DIAGNOSIS — Z6823 Body mass index (BMI) 23.0-23.9, adult: Secondary | ICD-10-CM | POA: Diagnosis not present

## 2017-08-28 DIAGNOSIS — E785 Hyperlipidemia, unspecified: Secondary | ICD-10-CM | POA: Diagnosis not present

## 2017-08-28 DIAGNOSIS — I10 Essential (primary) hypertension: Secondary | ICD-10-CM | POA: Diagnosis not present

## 2017-08-28 DIAGNOSIS — Z79899 Other long term (current) drug therapy: Secondary | ICD-10-CM | POA: Diagnosis not present

## 2017-08-28 DIAGNOSIS — R7301 Impaired fasting glucose: Secondary | ICD-10-CM | POA: Diagnosis not present

## 2017-08-28 DIAGNOSIS — Z7901 Long term (current) use of anticoagulants: Secondary | ICD-10-CM | POA: Diagnosis not present

## 2017-08-28 DIAGNOSIS — I4891 Unspecified atrial fibrillation: Secondary | ICD-10-CM | POA: Diagnosis not present

## 2017-08-30 NOTE — Progress Notes (Signed)
Chief Complaint  Patient presents with  . Follow-up    atrial fibrillation     History of Present Illness: 83 yo female with history of persistent atrial fibrillation, HTN, HLD, dementia and aortic valve insufficiency who is here today for cardiac follow up. She has persistent atrial fibrillation and is on Lopressor and coumadin. Last echo in 2011 with normal LV function and mild AI.  No repeat cardiac testing due to advanced age and dementia. Her dementia is stable per her husband. This is confirmed in Neurology notes.   She is here today for follow up. The patient denies any chest pain, dyspnea, palpitations, lower extremity edema, orthopnea, PND, dizziness, near syncope or syncope.   Primary Care Physician: Nicoletta Dress, MD  Past Medical History:  Diagnosis Date  . Chronic atrial fibrillation (HCC)    echo 9/09 EF 60%, mild MR  . Cognitive deficits 08/07/2013  . Diabetes mellitus   . HTN (hypertension)   . Hyperlipidemia     Past Surgical History:  Procedure Laterality Date  . APPENDECTOMY    . SHOULDER SURGERY     Bilateral    Current Outpatient Medications  Medication Sig Dispense Refill  . atorvastatin (LIPITOR) 40 MG tablet Take 40 mg by mouth at bedtime.  1  . benzonatate (TESSALON) 200 MG capsule Take 200 mg by mouth 3 (three) times daily as needed for cough.    . donepezil (ARICEPT) 5 MG tablet Take 1 tablet (5 mg total) by mouth at bedtime. 90 tablet 3  . fish oil-omega-3 fatty acids 1000 MG capsule Take 1 g by mouth daily.      . furosemide (LASIX) 40 MG tablet TAKE 1 TABLET (40 MG TOTAL) BY MOUTH DAILY. 90 tablet 2  . losartan (COZAAR) 25 MG tablet Take 1 tablet (25 mg total) by mouth daily. 90 tablet 2  . memantine (NAMENDA) 10 MG tablet Take 1 tablet (10 mg total) by mouth 2 (two) times daily. 180 tablet 3  . metoprolol tartrate (LOPRESSOR) 100 MG tablet Take 1 tablet (100 mg total) by mouth 2 (two) times daily. 180 tablet 3  . potassium chloride SA  (KLOR-CON M20) 20 MEQ tablet Take 2 tablets (40 mEq total) by mouth daily. 180 tablet 2  . warfarin (COUMADIN) 2 MG tablet Take 2 mg by mouth daily.  0  . warfarin (COUMADIN) 5 MG tablet Take 5 mg by mouth daily. Use as directed by Anticoagulation clinic.      No current facility-administered medications for this visit.     Allergies  Allergen Reactions  . Aspirin     PT UNSURE OF REACTION    Social History   Socioeconomic History  . Marital status: Married    Spouse name: Micheal  . Number of children: 3  . Years of education: Not on file  . Highest education level: Not on file  Social Needs  . Financial resource strain: Not on file  . Food insecurity - worry: Not on file  . Food insecurity - inability: Not on file  . Transportation needs - medical: Not on file  . Transportation needs - non-medical: Not on file  Occupational History  . Occupation: Retired-part time in Programmer, multimedia: RETIRED  Tobacco Use  . Smoking status: Former Smoker    Packs/day: 0.20    Years: 40.00    Pack years: 8.00    Types: Cigarettes    Last attempt to quit: 10/03/1982  Years since quitting: 34.9  . Smokeless tobacco: Never Used  Substance and Sexual Activity  . Alcohol use: No  . Drug use: No  . Sexual activity: Not on file  Other Topics Concern  . Not on file  Social History Narrative   Married, exercises regularly. Her PCP is Dr. Truman Hayward, cardiologist is Dr. Tempie Hoist. She lives independently in a private home, is married.     Her husband is here with her.    The patient is 87, born with Erb's Palsy, has three adult children, two daughters an   d a son, 66 years younger than his sisters. The patient drinks caffeinated beverages, is a non-smoker, non-drinker, handedness: ambidexterous    Family History  Problem Relation Age of Onset  . Cancer Mother   . Cancer Father   . Cancer Unknown        family history of it.     Review of Systems:  As stated in the HPI and  otherwise negative.   BP 100/60   Pulse 78   Ht 5\' 4"  (1.626 m)   Wt 131 lb (59.4 kg)   SpO2 97%   BMI 22.49 kg/m   Physical Examination:  General: Well developed, well nourished, NAD  HEENT: OP clear, mucus membranes moist  SKIN: warm, dry. No rashes. Neuro: No focal deficits  Musculoskeletal: Muscle strength 5/5 all ext  Psychiatric: Mood and affect normal  Neck: No JVD, no carotid bruits, no thyromegaly, no lymphadenopathy.  Lungs:Clear bilaterally, no wheezes, rhonci, crackles Cardiovascular: Irreg irreg. No murmurs, gallops or rubs. Abdomen:Soft. Bowel sounds present. Non-tender.  Extremities: No lower extremity edema. Pulses are 2 + in the bilateral DP/PT.  EKG:  EKG is not ordered today. The ekg ordered today demonstrates    Recent Labs: No results found for requested labs within last 8760 hours.   Lipid Panel Followed in primary care   Wt Readings from Last 3 Encounters:  08/31/17 131 lb (59.4 kg)  03/21/17 132 lb 3.2 oz (60 kg)  02/13/17 131 lb 6.4 oz (59.6 kg)     Other studies Reviewed: Additional studies/ records that were reviewed today include: . Review of the above records demonstrates:    Assessment and Plan:   1. Atrial fibrillation, persistent: She is in atrial fib. Her heart rate is controlled on Lopressor. Will continue coumadin.  She can consider changing to Eliquis.   2. HYPERTENSION: BP controlled. No changes.   3. Aortic insufficiency: Mild by echo in 2011. Will not repeat echo at this time.   Current medicines are reviewed at length with the patient today.  The patient does not have concerns regarding medicines.  The following changes have been made:    Labs/ tests ordered today include:   No orders of the defined types were placed in this encounter.   Disposition:   FU with me in 6 months  Signed, Lauree Chandler, MD 08/31/2017 11:28 AM    [facility] [place], [place], [place]   [address] Phone: [phone]; Fax: [phone]

## 2017-08-31 ENCOUNTER — Encounter (INDEPENDENT_AMBULATORY_CARE_PROVIDER_SITE_OTHER): Payer: Self-pay

## 2017-08-31 ENCOUNTER — Ambulatory Visit (INDEPENDENT_AMBULATORY_CARE_PROVIDER_SITE_OTHER): Payer: Medicare Other | Admitting: Cardiovascular Disease

## 2017-08-31 ENCOUNTER — Encounter: Payer: Self-pay | Admitting: Cardiovascular Disease

## 2017-08-31 VITALS — BP 100/60 | HR 78 | Ht 64.0 in | Wt 131.0 lb

## 2017-08-31 DIAGNOSIS — I351 Nonrheumatic aortic (valve) insufficiency: Secondary | ICD-10-CM | POA: Diagnosis not present

## 2017-08-31 DIAGNOSIS — I10 Essential (primary) hypertension: Secondary | ICD-10-CM

## 2017-08-31 DIAGNOSIS — I481 Persistent atrial fibrillation: Secondary | ICD-10-CM | POA: Diagnosis not present

## 2017-08-31 DIAGNOSIS — I4819 Other persistent atrial fibrillation: Secondary | ICD-10-CM

## 2017-08-31 NOTE — Patient Instructions (Signed)

## 2017-09-04 DIAGNOSIS — R791 Abnormal coagulation profile: Secondary | ICD-10-CM | POA: Diagnosis not present

## 2017-09-05 DIAGNOSIS — A084 Viral intestinal infection, unspecified: Secondary | ICD-10-CM | POA: Diagnosis not present

## 2017-09-12 DIAGNOSIS — R791 Abnormal coagulation profile: Secondary | ICD-10-CM | POA: Diagnosis not present

## 2017-09-18 ENCOUNTER — Encounter: Payer: Self-pay | Admitting: Adult Health

## 2017-09-18 ENCOUNTER — Ambulatory Visit (INDEPENDENT_AMBULATORY_CARE_PROVIDER_SITE_OTHER): Payer: Medicare Other | Admitting: Adult Health

## 2017-09-18 VITALS — BP 119/72 | HR 76 | Wt 130.4 lb

## 2017-09-18 DIAGNOSIS — R413 Other amnesia: Secondary | ICD-10-CM | POA: Diagnosis not present

## 2017-09-18 NOTE — Patient Instructions (Signed)
Your Plan:  Continue Aricept  And Namenda If your symptoms worsen or you develop new symptoms please let us know.   Thank you for coming to see [place] at [facility]. I hope we have been able to provide you high quality care today.  You may receive a patient satisfaction survey over the next few weeks. We would appreciate your feedback and comments so that we may continue to improve ourselves and the health of our patients.

## 2017-09-18 NOTE — Progress Notes (Signed)
I have read the note, and I agree with the clinical assessment and plan.  Charles K Willis   

## 2017-09-18 NOTE — Progress Notes (Signed)
PATIENT: [name] DOB: 20-Jul-1925  REASON FOR VISIT: follow up-memory HISTORY FROM: patient  HISTORY OF PRESENT ILLNESS: Today 09/18/17 [name] is a 83 year old female with a history of memory disturbance.  She returns today for follow-up.  She remains on Aricept and Namenda.  She lives at home with her husband.  She is able to complete all ADLs independently.  She no longer operates a motor vehicle.  Her husband manages all the finances.  She denies any trouble sleeping.  Denies any changes in her mood or behavior.  Reports that she continues to write checks and does this correctly.  She returns today for an evaluation.  HISTORY [name] is a 83 year old female with a history of memory disturbance. She returns today for follow-up. She is only taking Namenda and Aricept. She reports that she feels her memory might have gotten slightly worse. She was at home with her husband of 68 years. She is able to complete all ADLs independently. She no longer does any household chores. Reports that her husband handles all the finances. They both participate in cooking. She denies any difficulty preparing meals. She states that she's never been a good sleeper. She tends to sleep the best in her chair. Denies hallucinations. Denies any changes with her mood or behavior. She denies any significant depression. She returns today for an evaluation.  REVIEW OF SYSTEMS: Out of a complete 14 system review of symptoms, the patient complains only of the following symptoms, and all other reviewed systems are negative.  See HPI  ALLERGIES: Allergies  Allergen Reactions  . Aspirin     PT UNSURE OF REACTION    HOME MEDICATIONS: Outpatient Medications Prior to Visit  Medication Sig Dispense Refill  . atorvastatin (LIPITOR) 40 MG tablet Take 40 mg by mouth at bedtime.  1  . benzonatate (TESSALON) 200 MG capsule Take 200 mg by mouth 3 (three) times daily as needed for cough.    . donepezil (ARICEPT) 5  MG tablet Take 1 tablet (5 mg total) by mouth at bedtime. 90 tablet 3  . fish oil-omega-3 fatty acids 1000 MG capsule Take 1 g by mouth daily.      . furosemide (LASIX) 40 MG tablet TAKE 1 TABLET (40 MG TOTAL) BY MOUTH DAILY. 90 tablet 2  . losartan (COZAAR) 25 MG tablet Take 1 tablet (25 mg total) by mouth daily. 90 tablet 2  . memantine (NAMENDA) 10 MG tablet Take 1 tablet (10 mg total) by mouth 2 (two) times daily. 180 tablet 3  . metoprolol tartrate (LOPRESSOR) 100 MG tablet Take 1 tablet (100 mg total) by mouth 2 (two) times daily. 180 tablet 3  . potassium chloride SA (KLOR-CON M20) 20 MEQ tablet Take 2 tablets (40 mEq total) by mouth daily. 180 tablet 2  . warfarin (COUMADIN) 2 MG tablet Take 2 mg by mouth daily.  0  . warfarin (COUMADIN) 5 MG tablet Take 5 mg by mouth daily. Use as directed by Anticoagulation clinic.      No facility-administered medications prior to visit.     PAST MEDICAL HISTORY: Past Medical History:  Diagnosis Date  . Chronic atrial fibrillation (HCC)    echo 9/09 EF 60%, mild MR  . Cognitive deficits 08/07/2013  . Diabetes mellitus   . HTN (hypertension)   . Hyperlipidemia     PAST SURGICAL HISTORY: Past Surgical History:  Procedure Laterality Date  . APPENDECTOMY    . SHOULDER SURGERY  Bilateral    FAMILY HISTORY: Family History  Problem Relation Age of Onset  . Cancer Mother   . Cancer Father   . Cancer Unknown        family history of it.     SOCIAL HISTORY: Social History   Socioeconomic History  . Marital status: Married    Spouse name: Micheal  . Number of children: 3  . Years of education: Not on file  . Highest education level: Not on file  Social Needs  . Financial resource strain: Not on file  . Food insecurity - worry: Not on file  . Food insecurity - inability: Not on file  . Transportation needs - medical: Not on file  . Transportation needs - non-medical: Not on file  Occupational History  . Occupation:  Retired-part time in Programmer, multimedia: RETIRED  Tobacco Use  . Smoking status: Former Smoker    Packs/day: 0.20    Years: 40.00    Pack years: 8.00    Types: Cigarettes    Last attempt to quit: 10/03/1982    Years since quitting: 34.9  . Smokeless tobacco: Never Used  Substance and Sexual Activity  . Alcohol use: No  . Drug use: No  . Sexual activity: Not on file  Other Topics Concern  . Not on file  Social History Narrative   Married, exercises regularly. Her PCP is Dr. Truman Hayward, cardiologist is Dr. Tempie Hoist. She lives independently in a private home, is married.     Her husband is here with her.    The patient is 88, born with Erb's Palsy, has three adult children, two daughters an   d a son, 43 years younger than his sisters. The patient drinks caffeinated beverages, is a non-smoker, non-drinker, handedness: ambidexterous      PHYSICAL EXAM  Vitals:   09/18/17 1109  BP: 119/72  Pulse: 76  Weight: 130 lb 6.4 oz (59.1 kg)   Body mass index is 22.38 kg/m. MMSE - Mini Mental State Exam 03/21/2017 09/20/2016 08/08/2014  Orientation to time 3 2 3   Orientation to Place 5 3 3   Registration 3 3 3   Attention/ Calculation 0 4 5  Attention/Calculation-comments - - dlrow  Recall 0 0 0  Language- name 2 objects 2 2 2   Language- repeat 1 1 1   Language- follow 3 step command 3 3 3   Language- read & follow direction 1 1 1   Write a sentence 1 1 1   Copy design 1 1 1   Total score 20 21 23     Generalized: Well developed, in no acute distress   Neurological examination  Mentation: Alert. Follows all commands speech and language fluent Cranial nerve II-XII: Pupils were equal round reactive to light. Extraocular movements were full, visual field were full on confrontational test. Facial sensation and strength were normal. Uvula tongue midline. Head turning and shoulder shrug  were normal and symmetric. Motor: The motor testing reveals 5 over 5 strength of all 4 extremities. Good  symmetric motor tone is noted throughout.  Sensory: Sensory testing is intact to soft touch on all 4 extremities. No evidence of extinction is noted.  Coordination: Cerebellar testing reveals good finger-nose-finger and heel-to-shin bilaterally.  Gait and station: Gait is normal.  Reflexes: Deep tendon reflexes are symmetric and normal bilaterally.   DIAGNOSTIC DATA (LABS, IMAGING, TESTING) - I reviewed patient records, labs, notes, testing and imaging myself where available.    ASSESSMENT AND PLAN 83 y.o. year old female  has a past medical history of Chronic atrial fibrillation ([place]), Cognitive deficits (08/07/2013), Diabetes mellitus, HTN (hypertension), and Hyperlipidemia. here with:  1.  Memory disturbance  The patient's memory score has declined slightly.  She will continue on Aricept and Namenda.  I have encouraged the patient to continue to be socially active and engaged in activities such as word puzzles and word searches.  She voiced understanding.  She is advised that if her symptoms worsen or she develops new symptoms she should let us know.  She will follow-up in 6 months or sooner if needed.  I spent 15 minutes with the patient. 50% of this time was spent discussing her memory score.     Ward Givens, MSN, NP-C 09/18/2017, 10:48 AM [facility] [address], [place], [place] [address] [phone]

## 2017-09-25 DIAGNOSIS — R791 Abnormal coagulation profile: Secondary | ICD-10-CM | POA: Diagnosis not present

## 2017-10-02 DIAGNOSIS — R791 Abnormal coagulation profile: Secondary | ICD-10-CM | POA: Diagnosis not present

## 2017-10-09 DIAGNOSIS — R791 Abnormal coagulation profile: Secondary | ICD-10-CM | POA: Diagnosis not present

## 2017-10-16 DIAGNOSIS — R791 Abnormal coagulation profile: Secondary | ICD-10-CM | POA: Diagnosis not present

## 2017-10-20 DIAGNOSIS — R791 Abnormal coagulation profile: Secondary | ICD-10-CM | POA: Diagnosis not present

## 2017-10-24 ENCOUNTER — Other Ambulatory Visit: Payer: Self-pay | Admitting: Neurology

## 2017-10-24 DIAGNOSIS — F0280 Dementia in other diseases classified elsewhere without behavioral disturbance: Secondary | ICD-10-CM

## 2017-10-24 DIAGNOSIS — G3184 Mild cognitive impairment, so stated: Secondary | ICD-10-CM

## 2017-10-24 DIAGNOSIS — G301 Alzheimer's disease with late onset: Secondary | ICD-10-CM

## 2017-10-27 DIAGNOSIS — R791 Abnormal coagulation profile: Secondary | ICD-10-CM | POA: Diagnosis not present

## 2017-11-03 DIAGNOSIS — R791 Abnormal coagulation profile: Secondary | ICD-10-CM | POA: Diagnosis not present

## 2017-11-10 DIAGNOSIS — R791 Abnormal coagulation profile: Secondary | ICD-10-CM | POA: Diagnosis not present

## 2017-11-17 DIAGNOSIS — R791 Abnormal coagulation profile: Secondary | ICD-10-CM | POA: Diagnosis not present

## 2017-11-29 DIAGNOSIS — R7301 Impaired fasting glucose: Secondary | ICD-10-CM | POA: Diagnosis not present

## 2017-11-29 DIAGNOSIS — F0150 Vascular dementia without behavioral disturbance: Secondary | ICD-10-CM | POA: Diagnosis not present

## 2017-11-29 DIAGNOSIS — Z79899 Other long term (current) drug therapy: Secondary | ICD-10-CM | POA: Diagnosis not present

## 2017-11-29 DIAGNOSIS — I10 Essential (primary) hypertension: Secondary | ICD-10-CM | POA: Diagnosis not present

## 2017-11-29 DIAGNOSIS — E785 Hyperlipidemia, unspecified: Secondary | ICD-10-CM | POA: Diagnosis not present

## 2017-11-29 DIAGNOSIS — I4891 Unspecified atrial fibrillation: Secondary | ICD-10-CM | POA: Diagnosis not present

## 2017-12-01 DIAGNOSIS — R791 Abnormal coagulation profile: Secondary | ICD-10-CM | POA: Diagnosis not present

## 2017-12-08 DIAGNOSIS — R791 Abnormal coagulation profile: Secondary | ICD-10-CM | POA: Diagnosis not present

## 2017-12-22 DIAGNOSIS — R791 Abnormal coagulation profile: Secondary | ICD-10-CM | POA: Diagnosis not present

## 2017-12-25 DIAGNOSIS — R791 Abnormal coagulation profile: Secondary | ICD-10-CM | POA: Diagnosis not present

## 2018-01-01 DIAGNOSIS — R791 Abnormal coagulation profile: Secondary | ICD-10-CM | POA: Diagnosis not present

## 2018-01-15 DIAGNOSIS — R791 Abnormal coagulation profile: Secondary | ICD-10-CM | POA: Diagnosis not present

## 2018-01-28 ENCOUNTER — Other Ambulatory Visit: Payer: Self-pay | Admitting: Cardiovascular Disease

## 2018-02-15 DIAGNOSIS — Z7901 Long term (current) use of anticoagulants: Secondary | ICD-10-CM | POA: Diagnosis not present

## 2018-03-02 DIAGNOSIS — I10 Essential (primary) hypertension: Secondary | ICD-10-CM | POA: Diagnosis not present

## 2018-03-02 DIAGNOSIS — E785 Hyperlipidemia, unspecified: Secondary | ICD-10-CM | POA: Diagnosis not present

## 2018-03-02 DIAGNOSIS — Z79899 Other long term (current) drug therapy: Secondary | ICD-10-CM | POA: Diagnosis not present

## 2018-03-02 DIAGNOSIS — I4891 Unspecified atrial fibrillation: Secondary | ICD-10-CM | POA: Diagnosis not present

## 2018-03-02 DIAGNOSIS — R7301 Impaired fasting glucose: Secondary | ICD-10-CM | POA: Diagnosis not present

## 2018-03-05 ENCOUNTER — Ambulatory Visit: Payer: Medicare Other | Admitting: Cardiovascular Disease

## 2018-03-17 ENCOUNTER — Other Ambulatory Visit: Payer: Self-pay | Admitting: Cardiovascular Disease

## 2018-03-19 DIAGNOSIS — Z7901 Long term (current) use of anticoagulants: Secondary | ICD-10-CM | POA: Diagnosis not present

## 2018-03-20 ENCOUNTER — Encounter: Payer: Self-pay | Admitting: Adult Health

## 2018-03-20 ENCOUNTER — Ambulatory Visit (INDEPENDENT_AMBULATORY_CARE_PROVIDER_SITE_OTHER): Payer: Medicare Other | Admitting: Adult Health

## 2018-03-20 DIAGNOSIS — F0280 Dementia in other diseases classified elsewhere without behavioral disturbance: Secondary | ICD-10-CM | POA: Diagnosis not present

## 2018-03-20 DIAGNOSIS — G301 Alzheimer's disease with late onset: Secondary | ICD-10-CM

## 2018-03-20 MED ORDER — MEMANTINE HCL 10 MG PO TABS: 10.0000 mg | ORAL_TABLET | Freq: Two times a day (BID) | ORAL | 3 refills | Status: AC

## 2018-03-20 NOTE — Patient Instructions (Signed)
Your Plan:  Continue Aricept and Namenda If your symptoms worsen or you develop new symptoms please let us know.   Thank you for coming to see us at [facility]. I hope we have been able to provide you high quality care today.  You may receive a patient satisfaction survey over the next few weeks. We would appreciate your feedback and comments so that we may continue to improve ourselves and the health of our patients.  

## 2018-03-20 NOTE — Progress Notes (Signed)
PATIENT: [name] DOB: September 12, 1925  REASON FOR VISIT: follow up HISTORY FROM: patient  HISTORY OF PRESENT ILLNESS: Today 03/20/18:  [name] is a 84 year old female with a history of memory disturbance.  She returns today for follow-up.  She continues on Aricept and Namenda.  She reports that she is able to complete all ADLs independently.  She lives with her husband.  She reports good appetite.  Denies hallucinations.  Denies any changes in her mood or behavior.  She manages her own medications.  Her husband manages their finances.  She returns today for evaluation.   HISTORY 09/18/17 [name] is a 84 year old female with a history of memory disturbance.  She returns today for follow-up.  She remains on Aricept and Namenda.  She lives at home with her husband.  She is able to complete all ADLs independently.  She no longer operates a motor vehicle.  Her husband manages all the finances.  She denies any trouble sleeping.  Denies any changes in her mood or behavior.  Reports that she continues to write checks and does this correctly.  She returns today for an evaluation.  HISTORY [name] is a 84 year old female with a history of memory disturbance. She returns today for follow-up. She is only taking Namenda and Aricept. She reports that she feels her memory might have gotten slightly worse. She was at home with her husband of 11 years. She is able to complete all ADLs independently. She no longer does any household chores. Reports that her husband handles all the finances. They both participate in cooking. She denies any difficulty preparing meals. She states that she's never been a good sleeper. She tends to sleep the best in her chair. Denies hallucinations. Denies any changes with her mood or behavior. She denies any significant depression. She returns today for an evaluation   REVIEW OF SYSTEMS: Out of a complete 14 system review of symptoms, the patient complains only of the  following symptoms, and all other reviewed systems are negative.  ALLERGIES: Allergies  Allergen Reactions  . Aspirin     PT UNSURE OF REACTION    HOME MEDICATIONS: Outpatient Medications Prior to Visit  Medication Sig Dispense Refill  . atorvastatin (LIPITOR) 40 MG tablet Take 40 mg by mouth at bedtime.  1  . donepezil (ARICEPT) 5 MG tablet TAKE 1 TABLET BY MOUTH EVERY NIGHT 90 tablet 3  . fish oil-omega-3 fatty acids 1000 MG capsule Take 1 g by mouth daily.      . furosemide (LASIX) 40 MG tablet TAKE 1 TABLET (40 MG TOTAL) BY MOUTH DAILY. 90 tablet 2  . KLOR-CON M20 20 MEQ tablet TAKE 2 TABLETS (40 MEQ TOTAL) BY MOUTH DAILY. 180 tablet 1  . losartan (COZAAR) 25 MG tablet TAKE 1 TABLET BY MOUTH EVERY DAY 90 tablet 1  . memantine (NAMENDA) 10 MG tablet Take 1 tablet (10 mg total) by mouth 2 (two) times daily. 180 tablet 3  . metoprolol tartrate (LOPRESSOR) 100 MG tablet Take 1 tablet (100 mg total) by mouth 2 (two) times daily. 180 tablet 3  . warfarin (COUMADIN) 2 MG tablet Take 2 mg by mouth daily. 2.5 mg on Sunday & Monday, 2 mg Tuesday, Wednesday, Thursday, Friday, Saturday.  0   No facility-administered medications prior to visit.     PAST MEDICAL HISTORY: Past Medical History:  Diagnosis Date  . Chronic atrial fibrillation (HCC)    echo 9/09 EF 60%, mild MR  . Cognitive  deficits 08/07/2013  . Diabetes mellitus   . HTN (hypertension)   . Hyperlipidemia     PAST SURGICAL HISTORY: Past Surgical History:  Procedure Laterality Date  . APPENDECTOMY    . SHOULDER SURGERY     Bilateral    FAMILY HISTORY: Family History  Problem Relation Age of Onset  . Cancer Mother   . Cancer Father   . Cancer Unknown        family history of it.     SOCIAL HISTORY: Social History   Socioeconomic History  . Marital status: Married    Spouse name: Micheal  . Number of children: 3  . Years of education: Not on file  . Highest education level: Not on file  Occupational History   . Occupation: Retired-part time in Programmer, multimedia: RETIRED  Social Needs  . Financial resource strain: Not on file  . Food insecurity:    Worry: Not on file    Inability: Not on file  . Transportation needs:    Medical: Not on file    Non-medical: Not on file  Tobacco Use  . Smoking status: Former Smoker    Packs/day: 0.20    Years: 40.00    Pack years: 8.00    Types: Cigarettes    Last attempt to quit: 10/03/1982    Years since quitting: 35.4  . Smokeless tobacco: Never Used  Substance and Sexual Activity  . Alcohol use: No  . Drug use: No  . Sexual activity: Not on file  Lifestyle  . Physical activity:    Days per week: Not on file    Minutes per session: Not on file  . Stress: Not on file  Relationships  . Social connections:    Talks on phone: Not on file    Gets together: Not on file    Attends religious service: Not on file    Active member of club or organization: Not on file    Attends meetings of clubs or organizations: Not on file    Relationship status: Not on file  . Intimate partner violence:    Fear of current or ex partner: Not on file    Emotionally abused: Not on file    Physically abused: Not on file    Forced sexual activity: Not on file  Other Topics Concern  . Not on file  Social History Narrative   Married, exercises regularly. Her PCP is Dr. Truman Hayward, cardiologist is Dr. Tempie Hoist. She lives independently in a private home, is married.     Her husband is here with her.    The patient is 89, born with Erb's Palsy, has three adult children, two daughters an   d a son, 54 years younger than his sisters. The patient drinks caffeinated beverages, is a non-smoker, non-drinker, handedness: ambidexterous      PHYSICAL EXAM  Vitals:   03/20/18 1009  BP: 112/70  Pulse: 74  Weight: 130 lb 3.2 oz (59.1 kg)  Height: 5\' 4"  (1.626 m)   Body mass index is 22.38 kg/m.   MMSE - Mini Mental State Exam 03/20/2018 09/18/2017 03/21/2017    Orientation to time 2 1 3   Orientation to Place 3 2 5   Registration 3 3 3   Attention/ Calculation 2 3 0  Attention/Calculation-comments - - -  Recall 0 0 0  Language- name 2 objects 2 2 2   Language- repeat 1 1 1   Language- follow 3 step command 2 3 3   Language- read &  follow direction 1 1 1   Write a sentence 0 1 1  Copy design 1 1 1   Total score 17 18 20     Generalized: Well developed, in no acute distress   Neurological examination  Mentation: Alert oriented to time, place, history taking. Follows all commands speech and language fluent Cranial nerve II-XII: Pupils were equal round reactive to light. Extraocular movements were full, visual field were full on confrontational test. Facial sensation and strength were normal. Uvula tongue midline. Head turning and shoulder shrug  were normal and symmetric. Motor: The motor testing reveals 5 over 5 strength of all 4 extremities.  Limited range of motion in the upper extremities good symmetric motor tone is noted throughout.  Sensory: Sensory testing is intact to soft touch on all 4 extremities. No evidence of extinction is noted.  Coordination: Cerebellar testing reveals good finger-nose-finger and heel-to-shin bilaterally.  Gait and station: Gait is normal. Tandem gait is normal. Romberg is negative. No drift is seen.  Reflexes: Deep tendon reflexes are symmetric and normal bilaterally.   DIAGNOSTIC DATA (LABS, IMAGING, TESTING) - I reviewed patient records, labs, notes, testing and imaging myself where available.      ASSESSMENT AND PLAN 84 y.o. year old female  has a past medical history of Chronic atrial fibrillation ([place]), Cognitive deficits (08/07/2013), Diabetes mellitus, HTN (hypertension), and Hyperlipidemia. here with:  1.  Memory disturbance  The patient memory score has remained stable.  She will continue on Aricept and Namenda.  I have advised that if her symptoms worsen or she develops new symptoms she should let us  know.  She will follow-up in 6 months or sooner if needed.   I spent 15 minutes with the patient. 50% of this time was spent discussing her memory score   Ward Givens, MSN, NP-C 03/20/2018, 10:10 AM [facility] [address] [place], [place] [address] [phone]

## 2018-03-23 NOTE — Progress Notes (Signed)
I agree with the assessment and plan as directed by NP .The patient is known to me .   Velma Hanna, MD  

## 2018-03-29 DIAGNOSIS — Z0000 Encounter for general adult medical examination without abnormal findings: Secondary | ICD-10-CM | POA: Diagnosis not present

## 2018-03-29 DIAGNOSIS — Z136 Encounter for screening for cardiovascular disorders: Secondary | ICD-10-CM | POA: Diagnosis not present

## 2018-03-29 DIAGNOSIS — Z9181 History of falling: Secondary | ICD-10-CM | POA: Diagnosis not present

## 2018-03-29 DIAGNOSIS — E785 Hyperlipidemia, unspecified: Secondary | ICD-10-CM | POA: Diagnosis not present

## 2018-03-29 DIAGNOSIS — Z1331 Encounter for screening for depression: Secondary | ICD-10-CM | POA: Diagnosis not present

## 2018-03-31 ENCOUNTER — Other Ambulatory Visit: Payer: Self-pay | Admitting: Cardiovascular Disease

## 2018-04-03 NOTE — Progress Notes (Addendum)
Chief Complaint  Patient presents with  . Follow-up    atrial flutter   History of Present Illness: 84 yo female with history of persistent atrial fibrillation, HTN, HLD, dementia and aortic valve insufficiency who is here today for cardiac follow up. She has persistent atrial fibrillation and is on Lopressor and coumadin. Last echo in 2011 with normal LV function and mild AI.  No repeat cardiac testing due to advanced age and dementia.   She is here today for follow up. The patient denies any chest pain, dyspnea, palpitations, lower extremity edema, orthopnea, PND, dizziness, near syncope or syncope. ]  Primary Care Physician: Nicoletta Dress, MD  Past Medical History:  Diagnosis Date  . Chronic atrial fibrillation (HCC)    echo 9/09 EF 60%, mild MR  . Cognitive deficits 08/07/2013  . Diabetes mellitus   . HTN (hypertension)   . Hyperlipidemia     Past Surgical History:  Procedure Laterality Date  . APPENDECTOMY    . SHOULDER SURGERY     Bilateral    Current Outpatient Medications  Medication Sig Dispense Refill  . atorvastatin (LIPITOR) 40 MG tablet Take 40 mg by mouth at bedtime.  1  . donepezil (ARICEPT) 5 MG tablet TAKE 1 TABLET BY MOUTH EVERY NIGHT 90 tablet 3  . fish oil-omega-3 fatty acids 1000 MG capsule Take 1 g by mouth daily.      . furosemide (LASIX) 40 MG tablet TAKE 1 TABLET (40 MG TOTAL) BY MOUTH DAILY. 90 tablet 1  . KLOR-CON M20 20 MEQ tablet TAKE 2 TABLETS (40 MEQ TOTAL) BY MOUTH DAILY. 180 tablet 1  . losartan (COZAAR) 25 MG tablet TAKE 1 TABLET BY MOUTH EVERY DAY 90 tablet 1  . memantine (NAMENDA) 10 MG tablet Take 1 tablet (10 mg total) by mouth 2 (two) times daily. 180 tablet 3  . metoprolol tartrate (LOPRESSOR) 100 MG tablet Take 1 tablet (100 mg total) by mouth 2 (two) times daily. 180 tablet 3  . warfarin (COUMADIN) 2 MG tablet Take 2 mg by mouth daily. 2.5 mg on Sunday & Monday, 2 mg Tuesday, Wednesday, Thursday, Friday, Saturday.  0   No  current facility-administered medications for this visit.     Allergies  Allergen Reactions  . Aspirin     PT UNSURE OF REACTION    Social History   Socioeconomic History  . Marital status: Married    Spouse name: Micheal  . Number of children: 3  . Years of education: Not on file  . Highest education level: Not on file  Occupational History  . Occupation: Retired-part time in Programmer, multimedia: RETIRED  Social Needs  . Financial resource strain: Not on file  . Food insecurity:    Worry: Not on file    Inability: Not on file  . Transportation needs:    Medical: Not on file    Non-medical: Not on file  Tobacco Use  . Smoking status: Former Smoker    Packs/day: 0.20    Years: 40.00    Pack years: 8.00    Types: Cigarettes    Last attempt to quit: 10/03/1982    Years since quitting: 35.5  . Smokeless tobacco: Never Used  Substance and Sexual Activity  . Alcohol use: No  . Drug use: No  . Sexual activity: Not on file  Lifestyle  . Physical activity:    Days per week: Not on file    Minutes per session: Not  on file  . Stress: Not on file  Relationships  . Social connections:    Talks on phone: Not on file    Gets together: Not on file    Attends religious service: Not on file    Active member of club or organization: Not on file    Attends meetings of clubs or organizations: Not on file    Relationship status: Not on file  . Intimate partner violence:    Fear of current or ex partner: Not on file    Emotionally abused: Not on file    Physically abused: Not on file    Forced sexual activity: Not on file  Other Topics Concern  . Not on file  Social History Narrative   Married, exercises regularly. Her PCP is Dr. Truman Hayward, cardiologist is Dr. Tempie Hoist. She lives independently in a private home, is married.     Her husband is here with her.    The patient is 68, born with Erb's Palsy, has three adult children, two daughters an   d a son, 52 years younger  than his sisters. The patient drinks caffeinated beverages, is a non-smoker, non-drinker, handedness: ambidexterous    Family History  Problem Relation Age of Onset  . Cancer Mother   . Cancer Father   . Cancer Unknown        family history of it.     Review of Systems:  As stated in the HPI and otherwise negative.   BP 124/72   Pulse 72   Ht 5\' 4"  (1.626 m)   Wt 132 lb 6.4 oz (60.1 kg)   SpO2 94%   BMI 22.73 kg/m   Physical Examination:  General: Well developed, well nourished, NAD  HEENT: OP clear, mucus membranes moist  SKIN: warm, dry. No rashes. Neuro: No focal deficits  Musculoskeletal: Muscle strength 5/5 all ext  Psychiatric: Mood and affect normal  Neck: No JVD, no carotid bruits, no thyromegaly, no lymphadenopathy.  Lungs:Clear bilaterally, no wheezes, rhonci, crackles Cardiovascular: Irreg irreg No murmurs, gallops or rubs. Abdomen:Soft. Bowel sounds present. Non-tender.  Extremities: No lower extremity edema. Pulses are 2 + in the bilateral DP/PT.  EKG:  EKG is ordered today. The ekg ordered today demonstrates  Atrial flutter, rate 72 bpm  Recent Labs: No results found for requested labs within last 8760 hours.   Lipid Panel Followed in primary care   Wt Readings from Last 3 Encounters:  04/06/18 132 lb 6.4 oz (60.1 kg)  03/20/18 130 lb 3.2 oz (59.1 kg)  09/18/17 130 lb 6.4 oz (59.1 kg)     Other studies Reviewed: Additional studies/ records that were reviewed today include: . Review of the above records demonstrates:    Assessment and Plan:   1. Atrial fibrillation, persistent: Rate controlled today. Continue Lopressor and coumadin.  She has had no falls. If she begins to have trouble with falls/balance, would have to discuss stopping coumadin given her advanced age.   2. HYPERTENSION: BP is controlled. No changes.   3. Aortic insufficiency: Mild by echo in 2011. No plans to repeat echo.    Current medicines are reviewed at length with the  patient today.  The patient does not have concerns regarding medicines.  The following changes have been made:    Labs/ tests ordered today include:   No orders of the defined types were placed in this encounter.   Disposition:   FU with me in 6 months  Signed, Lauree Chandler, MD 04/06/2018  [address], [place], [place]  [address] Phone: [phone]; Fax: [phone]

## 2018-04-06 ENCOUNTER — Encounter: Payer: Self-pay | Admitting: Cardiovascular Disease

## 2018-04-06 ENCOUNTER — Ambulatory Visit (INDEPENDENT_AMBULATORY_CARE_PROVIDER_SITE_OTHER): Payer: Medicare Other | Admitting: Cardiovascular Disease

## 2018-04-06 VITALS — BP 124/72 | HR 72 | Ht 64.0 in | Wt 132.4 lb

## 2018-04-06 DIAGNOSIS — I10 Essential (primary) hypertension: Secondary | ICD-10-CM

## 2018-04-06 DIAGNOSIS — I351 Nonrheumatic aortic (valve) insufficiency: Secondary | ICD-10-CM | POA: Diagnosis not present

## 2018-04-06 DIAGNOSIS — I4819 Other persistent atrial fibrillation: Secondary | ICD-10-CM

## 2018-04-06 DIAGNOSIS — I481 Persistent atrial fibrillation: Secondary | ICD-10-CM | POA: Diagnosis not present

## 2018-04-06 NOTE — Patient Instructions (Signed)

## 2018-04-09 ENCOUNTER — Other Ambulatory Visit: Payer: Self-pay | Admitting: Cardiovascular Disease

## 2018-04-11 DIAGNOSIS — H6121 Impacted cerumen, right ear: Secondary | ICD-10-CM | POA: Diagnosis not present

## 2018-04-11 DIAGNOSIS — H6011 Cellulitis of right external ear: Secondary | ICD-10-CM | POA: Diagnosis not present

## 2018-04-16 DIAGNOSIS — H6691 Otitis media, unspecified, right ear: Secondary | ICD-10-CM | POA: Diagnosis not present

## 2018-04-16 DIAGNOSIS — H6011 Cellulitis of right external ear: Secondary | ICD-10-CM | POA: Diagnosis not present

## 2018-04-16 DIAGNOSIS — H6121 Impacted cerumen, right ear: Secondary | ICD-10-CM | POA: Diagnosis not present

## 2018-04-16 DIAGNOSIS — Z6823 Body mass index (BMI) 23.0-23.9, adult: Secondary | ICD-10-CM | POA: Diagnosis not present

## 2018-04-18 DIAGNOSIS — R791 Abnormal coagulation profile: Secondary | ICD-10-CM | POA: Diagnosis not present

## 2018-04-25 DIAGNOSIS — R791 Abnormal coagulation profile: Secondary | ICD-10-CM | POA: Diagnosis not present

## 2018-04-30 DIAGNOSIS — H6121 Impacted cerumen, right ear: Secondary | ICD-10-CM | POA: Diagnosis not present

## 2018-05-24 DIAGNOSIS — I9589 Other hypotension: Secondary | ICD-10-CM | POA: Diagnosis not present

## 2018-05-24 DIAGNOSIS — R001 Bradycardia, unspecified: Secondary | ICD-10-CM | POA: Diagnosis not present

## 2018-05-28 DIAGNOSIS — Z791 Long term (current) use of non-steroidal anti-inflammatories (NSAID): Secondary | ICD-10-CM | POA: Diagnosis not present

## 2018-06-08 DIAGNOSIS — I10 Essential (primary) hypertension: Secondary | ICD-10-CM | POA: Diagnosis not present

## 2018-06-08 DIAGNOSIS — R7301 Impaired fasting glucose: Secondary | ICD-10-CM | POA: Diagnosis not present

## 2018-06-08 DIAGNOSIS — I4891 Unspecified atrial fibrillation: Secondary | ICD-10-CM | POA: Diagnosis not present

## 2018-06-08 DIAGNOSIS — E785 Hyperlipidemia, unspecified: Secondary | ICD-10-CM | POA: Diagnosis not present

## 2018-06-08 DIAGNOSIS — Z79899 Other long term (current) drug therapy: Secondary | ICD-10-CM | POA: Diagnosis not present

## 2018-06-11 DIAGNOSIS — R791 Abnormal coagulation profile: Secondary | ICD-10-CM | POA: Diagnosis not present

## 2018-06-25 DIAGNOSIS — R791 Abnormal coagulation profile: Secondary | ICD-10-CM | POA: Diagnosis not present

## 2018-07-11 DIAGNOSIS — R2232 Localized swelling, mass and lump, left upper limb: Secondary | ICD-10-CM | POA: Diagnosis not present

## 2018-07-11 DIAGNOSIS — Z6824 Body mass index (BMI) 24.0-24.9, adult: Secondary | ICD-10-CM | POA: Diagnosis not present

## 2018-07-16 DIAGNOSIS — R791 Abnormal coagulation profile: Secondary | ICD-10-CM | POA: Diagnosis not present

## 2018-07-16 DIAGNOSIS — Z23 Encounter for immunization: Secondary | ICD-10-CM | POA: Diagnosis not present

## 2018-07-17 ENCOUNTER — Telehealth: Payer: Self-pay | Admitting: Cardiovascular Disease

## 2018-07-17 DIAGNOSIS — C4490 Unspecified malignant neoplasm of skin, unspecified: Secondary | ICD-10-CM | POA: Diagnosis not present

## 2018-07-17 NOTE — Telephone Encounter (Signed)
° °  [facility] Pre-operative Risk Assessment    Request for surgical clearance:  1. What type of surgery is being performed? Removal of Skin Cancer on her left arm   2. When is this surgery scheduled? 07-26-18   3. What type of clearance is required (medical clearance vs. Pharmacy clearance to hold med vs. Both)? both  4. Are there any medications that need to be held prior to surgery and how long?can pt stop her Coumadin, if so for how long?   5. Practice name and name of physician performing surgery? Dr Orrin Brigham  6. What is your office phone number [phone]    7.   What is your office fax number [phone]  8.   Anesthesia type (None, local, MAC, general) ? General   Glyn Ade 07/17/2018, 9:46 AM  _________________________________________________________________   (provider comments below)

## 2018-07-17 NOTE — Telephone Encounter (Signed)
   Primary Cardiologist: Lauree Chandler, MD  Chart reviewed as part of pre-operative protocol coverage.   Patient has a hx of permanent atrial fibrillation, mild to moderate aortic insufficiency.  She was last seen by Dr. Angelena Form in 04/2018.  She was contacted today and allowed her husband to speak for her.  She has not had any chest pain or shortness of breath since she was last seen.    The Revised Cardiac Risk Index indicates that her perioperative risk of major cardiac event is low at 0.4%.    Given past medical history and time since last visit, based on ACC/AHA guidelines, [name] would be at acceptable risk for the planned procedure without further cardiovascular testing.   Her Coumadin is managed by her primary care doctor Delena Bali, Lora Havens, MD).  Recommendations regarding her anticoagulation in the perioperative period should be made by Dr. Delena Bali.  Call back staff: Note sent to surgeon.  Please make sure it was received. Please make sure surgeon's office understands that the patients coumadin is followed by primary care and recommendations for holding coumadin around the surgery will come from primary care.  Note will be removed from preop pool.   Richardson Dopp, PA-C 07/17/2018, 4:44 PM

## 2018-07-18 NOTE — Telephone Encounter (Signed)
I have confirmed with Dr. Leitha Schuller office clearance letter has been recv'd. I will removed from the call back pool.

## 2018-07-23 DIAGNOSIS — R791 Abnormal coagulation profile: Secondary | ICD-10-CM | POA: Diagnosis not present

## 2018-07-26 DIAGNOSIS — C4490 Unspecified malignant neoplasm of skin, unspecified: Secondary | ICD-10-CM | POA: Diagnosis not present

## 2018-07-26 DIAGNOSIS — C44609 Unspecified malignant neoplasm of skin of left upper limb, including shoulder: Secondary | ICD-10-CM | POA: Diagnosis not present

## 2018-07-26 DIAGNOSIS — L858 Other specified epidermal thickening: Secondary | ICD-10-CM | POA: Diagnosis not present

## 2018-07-26 DIAGNOSIS — C44629 Squamous cell carcinoma of skin of left upper limb, including shoulder: Secondary | ICD-10-CM | POA: Diagnosis not present

## 2018-07-26 DIAGNOSIS — R9431 Abnormal electrocardiogram [ECG] [EKG]: Secondary | ICD-10-CM | POA: Diagnosis not present

## 2018-07-26 DIAGNOSIS — D649 Anemia, unspecified: Secondary | ICD-10-CM | POA: Diagnosis not present

## 2018-07-26 DIAGNOSIS — C44699 Other specified malignant neoplasm of skin of left upper limb, including shoulder: Secondary | ICD-10-CM | POA: Diagnosis not present

## 2018-07-26 DIAGNOSIS — Z7901 Long term (current) use of anticoagulants: Secondary | ICD-10-CM | POA: Diagnosis not present

## 2018-07-26 DIAGNOSIS — Z01810 Encounter for preprocedural cardiovascular examination: Secondary | ICD-10-CM | POA: Diagnosis not present

## 2018-07-26 DIAGNOSIS — Z79899 Other long term (current) drug therapy: Secondary | ICD-10-CM | POA: Diagnosis not present

## 2018-07-29 ENCOUNTER — Other Ambulatory Visit: Payer: Self-pay | Admitting: Cardiovascular Disease

## 2018-07-30 DIAGNOSIS — R791 Abnormal coagulation profile: Secondary | ICD-10-CM | POA: Diagnosis not present

## 2018-08-02 DIAGNOSIS — R791 Abnormal coagulation profile: Secondary | ICD-10-CM | POA: Diagnosis not present

## 2018-08-03 DIAGNOSIS — C44629 Squamous cell carcinoma of skin of left upper limb, including shoulder: Secondary | ICD-10-CM | POA: Diagnosis not present

## 2018-08-03 DIAGNOSIS — Z6824 Body mass index (BMI) 24.0-24.9, adult: Secondary | ICD-10-CM | POA: Diagnosis not present

## 2018-08-09 DIAGNOSIS — R791 Abnormal coagulation profile: Secondary | ICD-10-CM | POA: Diagnosis not present

## 2018-08-18 ENCOUNTER — Other Ambulatory Visit: Payer: Self-pay | Admitting: Neurology

## 2018-08-18 DIAGNOSIS — F0280 Dementia in other diseases classified elsewhere without behavioral disturbance: Secondary | ICD-10-CM

## 2018-08-18 DIAGNOSIS — G301 Alzheimer's disease with late onset: Secondary | ICD-10-CM

## 2018-08-18 DIAGNOSIS — G3184 Mild cognitive impairment, so stated: Secondary | ICD-10-CM

## 2018-08-23 DIAGNOSIS — R791 Abnormal coagulation profile: Secondary | ICD-10-CM | POA: Diagnosis not present

## 2018-09-02 ENCOUNTER — Encounter

## 2018-09-07 DIAGNOSIS — C44629 Squamous cell carcinoma of skin of left upper limb, including shoulder: Secondary | ICD-10-CM | POA: Diagnosis not present

## 2018-09-07 DIAGNOSIS — Z6824 Body mass index (BMI) 24.0-24.9, adult: Secondary | ICD-10-CM | POA: Diagnosis not present

## 2018-09-21 DIAGNOSIS — R791 Abnormal coagulation profile: Secondary | ICD-10-CM | POA: Diagnosis not present

## 2018-09-28 DIAGNOSIS — R7301 Impaired fasting glucose: Secondary | ICD-10-CM | POA: Diagnosis not present

## 2018-09-28 DIAGNOSIS — E785 Hyperlipidemia, unspecified: Secondary | ICD-10-CM | POA: Diagnosis not present

## 2018-09-28 DIAGNOSIS — Z79899 Other long term (current) drug therapy: Secondary | ICD-10-CM | POA: Diagnosis not present

## 2018-09-28 DIAGNOSIS — I4891 Unspecified atrial fibrillation: Secondary | ICD-10-CM | POA: Diagnosis not present

## 2018-09-28 DIAGNOSIS — I10 Essential (primary) hypertension: Secondary | ICD-10-CM | POA: Diagnosis not present

## 2018-10-05 DIAGNOSIS — R791 Abnormal coagulation profile: Secondary | ICD-10-CM | POA: Diagnosis not present

## 2018-10-06 ENCOUNTER — Other Ambulatory Visit: Payer: Self-pay | Admitting: Cardiovascular Disease

## 2018-10-16 ENCOUNTER — Ambulatory Visit (INDEPENDENT_AMBULATORY_CARE_PROVIDER_SITE_OTHER): Payer: Medicare Other | Admitting: Adult Health

## 2018-10-16 ENCOUNTER — Encounter: Payer: Self-pay | Admitting: *Deleted

## 2018-10-16 VITALS — BP 126/72 | HR 72 | Ht 64.0 in | Wt 128.0 lb

## 2018-10-16 DIAGNOSIS — G301 Alzheimer's disease with late onset: Secondary | ICD-10-CM | POA: Diagnosis not present

## 2018-10-16 DIAGNOSIS — F0280 Dementia in other diseases classified elsewhere without behavioral disturbance: Secondary | ICD-10-CM

## 2018-10-16 NOTE — Progress Notes (Signed)
I have read the note, and I agree with the clinical assessment and plan.  Arnel Wymer K Cleland Simkins   

## 2018-10-16 NOTE — Patient Instructions (Signed)
Your Plan:  Continue Aricept and Namenda Memory score is stable If your symptoms worsen or you develop new symptoms please let us know.    Thank you for coming to see us at [facility]. I hope we have been able to provide you high quality care today.  You may receive a patient satisfaction survey over the next few weeks. We would appreciate your feedback and comments so that we may continue to improve ourselves and the health of our patients.  

## 2018-10-16 NOTE — Progress Notes (Signed)
PATIENT: [name] DOB: [birth year]  REASON FOR VISIT: follow up HISTORY FROM: patient  HISTORY OF PRESENT ILLNESS: Today 10/16/18 :  [name] is a 85 year old female with a history of memory disturbance.  She returns today for follow-up.  She remains on Aricept and Namenda.  She continues to live at home with her husband.  She is able to complete all ADLs independently.  Denies any trouble sleeping.  Denies hallucinations.  She manages her own medication and appointments.  Her husband manages their finances.  She denies any significant changes with her mood or behavior.  She returns today for evaluation.  HISTORY 03/20/18:  [name] is a 85 year old female with a history of memory disturbance.  She returns today for follow-up.  She continues on Aricept and Namenda.  She reports that she is able to complete all ADLs independently.  She lives with her husband.  She reports good appetite.  Denies hallucinations.  Denies any changes in her mood or behavior.  She manages her own medications.  Her husband manages their finances.  She returns today for evaluation.   REVIEW OF SYSTEMS: Out of a complete 14 system review of symptoms, the patient complains only of the following symptoms, and all other reviewed systems are negative.   See HPI  ALLERGIES: Allergies  Allergen Reactions  . Aspirin     PT UNSURE OF REACTION    HOME MEDICATIONS: Outpatient Medications Prior to Visit  Medication Sig Dispense Refill  . atorvastatin (LIPITOR) 40 MG tablet Take 40 mg by mouth at bedtime.  1  . donepezil (ARICEPT) 5 MG tablet TAKE 1 TABLET BY MOUTH EVERY NIGHT 90 tablet 1  . fish oil-omega-3 fatty acids 1000 MG capsule Take 1 g by mouth daily.      . furosemide (LASIX) 40 MG tablet TAKE 1 TABLET (40 MG TOTAL) BY MOUTH DAILY. 90 tablet 1  . KLOR-CON M20 20 MEQ tablet TAKE 2 TABLETS (40 MEQ TOTAL) BY MOUTH DAILY. 180 tablet 2  . memantine (NAMENDA) 10 MG tablet Take 1 tablet (10 mg total)  by mouth 2 (two) times daily. 180 tablet 3  . metoprolol tartrate (LOPRESSOR) 100 MG tablet TAKE 1 TABLET (100 MG TOTAL) BY MOUTH 2 (TWO) TIMES DAILY. 180 tablet 3  . warfarin (COUMADIN) 2 MG tablet Take 2 mg by mouth daily. 2.5 mg on Sunday & Monday, 2 mg Tuesday, Wednesday, Thursday, Friday, Saturday.  0  . losartan (COZAAR) 25 MG tablet TAKE 1 TABLET BY MOUTH EVERY DAY 90 tablet 1   No facility-administered medications prior to visit.     PAST MEDICAL HISTORY: Past Medical History:  Diagnosis Date  . Chronic atrial fibrillation    echo 9/09 EF 60%, mild MR  . Cognitive deficits 08/07/2013  . Diabetes mellitus   . HTN (hypertension)   . Hyperlipidemia     PAST SURGICAL HISTORY: Past Surgical History:  Procedure Laterality Date  . APPENDECTOMY    . SHOULDER SURGERY     Bilateral    FAMILY HISTORY: Family History  Problem Relation Age of Onset  . Cancer Mother   . Cancer Father   . Cancer Other        family history of it.     SOCIAL HISTORY: Social History   Socioeconomic History  . Marital status: Married    Spouse name: Micheal  . Number of children: 3  . Years of education: 60  . Highest education level: Not on file  Occupational History  . Occupation: Retired-part time in Programmer, multimedia: RETIRED  Social Needs  . Financial resource strain: Not on file  . Food insecurity:    Worry: Not on file    Inability: Not on file  . Transportation needs:    Medical: Not on file    Non-medical: Not on file  Tobacco Use  . Smoking status: Former Smoker    Packs/day: 0.20    Years: 40.00    Pack years: 8.00    Types: Cigarettes    Last attempt to quit: 10/03/1982    Years since quitting: 36.0  . Smokeless tobacco: Never Used  Substance and Sexual Activity  . Alcohol use: No  . Drug use: No  . Sexual activity: Not on file  Lifestyle  . Physical activity:    Days per week: Not on file    Minutes per session: Not on file  . Stress: Not on file    Relationships  . Social connections:    Talks on phone: Not on file    Gets together: Not on file    Attends religious service: Not on file    Active member of club or organization: Not on file    Attends meetings of clubs or organizations: Not on file    Relationship status: Not on file  . Intimate partner violence:    Fear of current or ex partner: Not on file    Emotionally abused: Not on file    Physically abused: Not on file    Forced sexual activity: Not on file  Other Topics Concern  . Not on file  Social History Narrative   Married, exercises regularly. Her PCP is Dr. Truman Hayward, cardiologist is Dr. Tempie Hoist. She lives independently in a private home, is married.     Her husband is here with her.    The patient is 33, born with Erb's Palsy, has three adult children, two daughters and a son, 14 years younger than his sisters. The patient drinks caffeinated beverages, is a non-smoker, non-drinker, handedness: ambidexterous      PHYSICAL EXAM  Vitals:   10/16/18 1304  BP: 126/72  Pulse: 72  Weight: 128 lb (58.1 kg)  Height: 5\' 4"  (1.626 m)   Body mass index is 21.97 kg/m.   MMSE - Mini Mental State Exam 10/16/2018 03/20/2018 09/18/2017  Orientation to time 1 2 1   Orientation to Place 4 3 2   Registration 3 3 3   Attention/ Calculation 1 2 3   Attention/Calculation-comments - - -  Recall 2 0 0  Language- name 2 objects 2 2 2   Language- repeat 1 1 1   Language- follow 3 step command 3 2 3   Language- read & follow direction 1 1 1   Write a sentence 1 0 1  Copy design 0 1 1  Total score 19 17 18      Generalized: Well developed, in no acute distress   Neurological examination  Mentation: Alert oriented to time, place, history taking. Follows all commands speech and language fluent Cranial nerve II-XII: Pupils were equal round reactive to light. Extraocular movements were full, visual field were full on confrontational test. Facial sensation and strength were normal. Uvula  tongue midline. Head turning and shoulder shrug  were normal and symmetric. Motor: The motor testing reveals 5 over 5 strength of all 4 extremities. Good symmetric motor tone is noted throughout.  Limited movement in the upper extremities Sensory: Sensory testing is intact to soft touch on  all 4 extremities. No evidence of extinction is noted.  Coordination: Cerebellar testing reveals good finger-nose-finger and heel-to-shin bilaterally.  Gait and station: Gait is slightly unsteady.  Tandem gait not attempted.   DIAGNOSTIC DATA (LABS, IMAGING, TESTING) - I reviewed patient records, labs, notes, testing and imaging myself where available.      Component Value Date/Time   NA 140 04/14/2011 1006   K 3.3 (L) 04/14/2011 1006   CL 99 04/14/2011 1006   CO2 26 04/14/2011 1006   GLUCOSE 128 (H) 04/14/2011 1006   BUN 23 04/14/2011 1006   CREATININE 1.4 (H) 04/14/2011 1006   CALCIUM 8.8 04/14/2011 1006   Lab Results  Component Value Date   TSH 1.29 09/15/2010      ASSESSMENT AND PLAN 85 y.o. year old female  has a past medical history of Chronic atrial fibrillation, Cognitive deficits (08/07/2013), Diabetes mellitus, HTN (hypertension), and Hyperlipidemia. here with:  1.  Alzheimer's disease  The patient's memory score has remained stable.  She will continue on Namenda and Aricept.  I have advised that if her symptoms worsen or she develops new symptoms she should let us know.  She will follow-up in 1 year or sooner if needed.   I spent 15 minutes with the patient. 50% of this time was spent reviewing memory score   Ward Givens, MSN, NP-C 10/16/2018, 1:24 PM [facility] [address] [place], [place] [address] [phone]

## 2018-10-19 DIAGNOSIS — R791 Abnormal coagulation profile: Secondary | ICD-10-CM | POA: Diagnosis not present

## 2018-11-01 ENCOUNTER — Encounter: Payer: Self-pay | Admitting: Cardiovascular Disease

## 2018-11-01 ENCOUNTER — Ambulatory Visit (INDEPENDENT_AMBULATORY_CARE_PROVIDER_SITE_OTHER): Payer: Medicare Other | Admitting: Cardiovascular Disease

## 2018-11-01 VITALS — BP 137/83 | HR 83 | Ht 64.0 in | Wt 131.6 lb

## 2018-11-01 DIAGNOSIS — I351 Nonrheumatic aortic (valve) insufficiency: Secondary | ICD-10-CM

## 2018-11-01 DIAGNOSIS — I4819 Other persistent atrial fibrillation: Secondary | ICD-10-CM | POA: Diagnosis not present

## 2018-11-01 DIAGNOSIS — I10 Essential (primary) hypertension: Secondary | ICD-10-CM

## 2018-11-01 NOTE — Progress Notes (Signed)
Chief Complaint  Patient presents with  . Follow-up    PAF   History of Present Illness: 85 yo female with history of persistent atrial fibrillation, HTN, HLD, dementia and aortic valve insufficiency who is here today for cardiac follow up. She has persistent atrial fibrillation and is on Lopressor and coumadin. Last echo in 2011 with normal LV function and mild AI.  No repeat cardiac testing due to advanced age and dementia.   She is here today for follow up. The patient denies any chest pain, dyspnea, palpitations, lower extremity edema, orthopnea, PND, dizziness, near syncope or syncope.   Primary Care Physician: Nicoletta Dress, MD  Past Medical History:  Diagnosis Date  . Chronic atrial fibrillation    echo 9/09 EF 60%, mild MR  . Cognitive deficits 08/07/2013  . Diabetes mellitus   . HTN (hypertension)   . Hyperlipidemia     Past Surgical History:  Procedure Laterality Date  . APPENDECTOMY    . SHOULDER SURGERY     Bilateral    Current Outpatient Medications  Medication Sig Dispense Refill  . atorvastatin (LIPITOR) 40 MG tablet Take 40 mg by mouth at bedtime.  1  . donepezil (ARICEPT) 5 MG tablet TAKE 1 TABLET BY MOUTH EVERY NIGHT 90 tablet 1  . fish oil-omega-3 fatty acids 1000 MG capsule Take 1 g by mouth daily.      . furosemide (LASIX) 40 MG tablet TAKE 1 TABLET (40 MG TOTAL) BY MOUTH DAILY. 90 tablet 1  . KLOR-CON M20 20 MEQ tablet TAKE 2 TABLETS (40 MEQ TOTAL) BY MOUTH DAILY. 180 tablet 2  . losartan (COZAAR) 25 MG tablet TAKE 1 TABLET BY MOUTH EVERY DAY 90 tablet 1  . memantine (NAMENDA) 10 MG tablet Take 1 tablet (10 mg total) by mouth 2 (two) times daily. 180 tablet 3  . metoprolol tartrate (LOPRESSOR) 100 MG tablet TAKE 1 TABLET (100 MG TOTAL) BY MOUTH 2 (TWO) TIMES DAILY. 180 tablet 3  . warfarin (COUMADIN) 2 MG tablet Take 2 mg by mouth daily. 2.5 mg on Sunday & Monday, 2 mg Tuesday, Wednesday, Thursday, Friday, Saturday.  0   No current  facility-administered medications for this visit.     Allergies  Allergen Reactions  . Aspirin     PT UNSURE OF REACTION    Social History   Socioeconomic History  . Marital status: Married    Spouse name: Micheal  . Number of children: 3  . Years of education: 55  . Highest education level: Not on file  Occupational History  . Occupation: Retired-part time in Programmer, multimedia: RETIRED  Social Needs  . Financial resource strain: Not on file  . Food insecurity:    Worry: Not on file    Inability: Not on file  . Transportation needs:    Medical: Not on file    Non-medical: Not on file  Tobacco Use  . Smoking status: Former Smoker    Packs/day: 0.20    Years: 40.00    Pack years: 8.00    Types: Cigarettes    Last attempt to quit: 10/03/1982    Years since quitting: 36.1  . Smokeless tobacco: Never Used  Substance and Sexual Activity  . Alcohol use: No  . Drug use: No  . Sexual activity: Not on file  Lifestyle  . Physical activity:    Days per week: Not on file    Minutes per session: Not on file  .  Stress: Not on file  Relationships  . Social connections:    Talks on phone: Not on file    Gets together: Not on file    Attends religious service: Not on file    Active member of club or organization: Not on file    Attends meetings of clubs or organizations: Not on file    Relationship status: Not on file  . Intimate partner violence:    Fear of current or ex partner: Not on file    Emotionally abused: Not on file    Physically abused: Not on file    Forced sexual activity: Not on file  Other Topics Concern  . Not on file  Social History Narrative   Married, exercises regularly. Her PCP is Dr. Truman Hayward, cardiologist is Dr. Tempie Hoist. She lives independently in a private home, is married.     Her husband is here with her.    The patient is 9, born with Erb's Palsy, has three adult children, two daughters and a son, 37 years younger than his sisters. The  patient drinks caffeinated beverages, is a non-smoker, non-drinker, handedness: ambidexterous    Family History  Problem Relation Age of Onset  . Cancer Mother   . Cancer Father   . Cancer Other        family history of it.     Review of Systems:  As stated in the HPI and otherwise negative.   BP 137/83   Pulse 83   Ht 5\' 4"  (1.626 m)   Wt 131 lb 9.6 oz (59.7 kg)   SpO2 99%   BMI 22.59 kg/m   Physical Examination:  General: Well developed, well nourished, NAD  HEENT: OP clear, mucus membranes moist  SKIN: warm, dry. No rashes. Neuro: No focal deficits  Musculoskeletal: Muscle strength 5/5 all ext  Psychiatric: Mood and affect normal  Neck: No JVD, no carotid bruits, no thyromegaly, no lymphadenopathy.  Lungs:Clear bilaterally, no wheezes, rhonci, crackles Cardiovascular:Irreg irreg. No murmurs, gallops or rubs. Abdomen:Soft. Bowel sounds present. Non-tender.  Extremities: No lower extremity edema. Pulses are 2 + in the bilateral DP/PT.   EKG:  EKG is not ordered today. The ekg ordered today demonstrates    Recent Labs: No results found for requested labs within last 8760 hours.   Lipid Panel Followed in primary care   Wt Readings from Last 3 Encounters:  11/01/18 131 lb 9.6 oz (59.7 kg)  10/16/18 128 lb (58.1 kg)  04/06/18 132 lb 6.4 oz (60.1 kg)     Other studies Reviewed: Additional studies/ records that were reviewed today include: . Review of the above records demonstrates:    Assessment and Plan:   1. Atrial fibrillation, persistent: Rate controlled atrial fib today. Continue beta blocker and coumadin.    2. HYPERTENSION: BP is well controlled. No changes  3. Aortic insufficiency: Mild by echo in 2011. No plans to repeat echo.    Current medicines are reviewed at length with the patient today.  The patient does not have concerns regarding medicines.  The following changes have been made:    Labs/ tests ordered today include:   No orders of  the defined types were placed in this encounter.   Disposition:   FU with me in 6 months  Signed, Lauree Chandler, MD 11/01/2018 10:41 AM    [facility] [address], [place], [place]  [address] Phone: [phone]; Fax: [phone]

## 2018-11-01 NOTE — Patient Instructions (Signed)

## 2018-11-02 DIAGNOSIS — R791 Abnormal coagulation profile: Secondary | ICD-10-CM | POA: Diagnosis not present

## 2018-11-09 DIAGNOSIS — R791 Abnormal coagulation profile: Secondary | ICD-10-CM | POA: Diagnosis not present

## 2018-11-14 DIAGNOSIS — M47816 Spondylosis without myelopathy or radiculopathy, lumbar region: Secondary | ICD-10-CM | POA: Diagnosis not present

## 2018-11-14 DIAGNOSIS — M545 Low back pain: Secondary | ICD-10-CM | POA: Diagnosis not present

## 2018-11-14 DIAGNOSIS — M5136 Other intervertebral disc degeneration, lumbar region: Secondary | ICD-10-CM | POA: Diagnosis not present

## 2018-11-23 DIAGNOSIS — R791 Abnormal coagulation profile: Secondary | ICD-10-CM | POA: Diagnosis not present

## 2018-12-07 DIAGNOSIS — R791 Abnormal coagulation profile: Secondary | ICD-10-CM | POA: Diagnosis not present

## 2018-12-14 DIAGNOSIS — R791 Abnormal coagulation profile: Secondary | ICD-10-CM | POA: Diagnosis not present

## 2018-12-28 DIAGNOSIS — R791 Abnormal coagulation profile: Secondary | ICD-10-CM | POA: Diagnosis not present

## 2019-01-01 DIAGNOSIS — I10 Essential (primary) hypertension: Secondary | ICD-10-CM | POA: Diagnosis not present

## 2019-01-01 DIAGNOSIS — R791 Abnormal coagulation profile: Secondary | ICD-10-CM | POA: Diagnosis not present

## 2019-01-01 DIAGNOSIS — I4891 Unspecified atrial fibrillation: Secondary | ICD-10-CM | POA: Diagnosis not present

## 2019-01-01 DIAGNOSIS — E785 Hyperlipidemia, unspecified: Secondary | ICD-10-CM | POA: Diagnosis not present

## 2019-01-01 DIAGNOSIS — Z79899 Other long term (current) drug therapy: Secondary | ICD-10-CM | POA: Diagnosis not present

## 2019-01-01 DIAGNOSIS — R7301 Impaired fasting glucose: Secondary | ICD-10-CM | POA: Diagnosis not present

## 2019-01-08 DIAGNOSIS — R791 Abnormal coagulation profile: Secondary | ICD-10-CM | POA: Diagnosis not present

## 2019-02-07 DIAGNOSIS — R791 Abnormal coagulation profile: Secondary | ICD-10-CM | POA: Diagnosis not present

## 2019-03-11 DIAGNOSIS — Z7901 Long term (current) use of anticoagulants: Secondary | ICD-10-CM | POA: Diagnosis not present

## 2019-03-25 DIAGNOSIS — R791 Abnormal coagulation profile: Secondary | ICD-10-CM | POA: Diagnosis not present

## 2019-04-03 DIAGNOSIS — Z136 Encounter for screening for cardiovascular disorders: Secondary | ICD-10-CM | POA: Diagnosis not present

## 2019-04-03 DIAGNOSIS — E785 Hyperlipidemia, unspecified: Secondary | ICD-10-CM | POA: Diagnosis not present

## 2019-04-03 DIAGNOSIS — Z9181 History of falling: Secondary | ICD-10-CM | POA: Diagnosis not present

## 2019-04-03 DIAGNOSIS — Z1331 Encounter for screening for depression: Secondary | ICD-10-CM | POA: Diagnosis not present

## 2019-04-03 DIAGNOSIS — Z0000 Encounter for general adult medical examination without abnormal findings: Secondary | ICD-10-CM | POA: Diagnosis not present

## 2019-04-03 DIAGNOSIS — N959 Unspecified menopausal and perimenopausal disorder: Secondary | ICD-10-CM | POA: Diagnosis not present

## 2019-04-03 DIAGNOSIS — Z1231 Encounter for screening mammogram for malignant neoplasm of breast: Secondary | ICD-10-CM | POA: Diagnosis not present

## 2019-04-04 DIAGNOSIS — I4891 Unspecified atrial fibrillation: Secondary | ICD-10-CM | POA: Diagnosis not present

## 2019-04-04 DIAGNOSIS — R51 Headache: Secondary | ICD-10-CM | POA: Diagnosis not present

## 2019-04-04 DIAGNOSIS — F0390 Unspecified dementia without behavioral disturbance: Secondary | ICD-10-CM | POA: Diagnosis not present

## 2019-04-04 DIAGNOSIS — Z79899 Other long term (current) drug therapy: Secondary | ICD-10-CM | POA: Diagnosis not present

## 2019-04-04 DIAGNOSIS — S0101XA Laceration without foreign body of scalp, initial encounter: Secondary | ICD-10-CM | POA: Diagnosis not present

## 2019-04-04 DIAGNOSIS — R58 Hemorrhage, not elsewhere classified: Secondary | ICD-10-CM | POA: Diagnosis not present

## 2019-04-04 DIAGNOSIS — I4589 Other specified conduction disorders: Secondary | ICD-10-CM | POA: Diagnosis not present

## 2019-04-04 DIAGNOSIS — I482 Chronic atrial fibrillation, unspecified: Secondary | ICD-10-CM | POA: Diagnosis not present

## 2019-04-04 DIAGNOSIS — Z7901 Long term (current) use of anticoagulants: Secondary | ICD-10-CM | POA: Diagnosis not present

## 2019-04-04 DIAGNOSIS — E785 Hyperlipidemia, unspecified: Secondary | ICD-10-CM | POA: Diagnosis not present

## 2019-04-04 DIAGNOSIS — I10 Essential (primary) hypertension: Secondary | ICD-10-CM | POA: Diagnosis not present

## 2019-04-04 DIAGNOSIS — S299XXA Unspecified injury of thorax, initial encounter: Secondary | ICD-10-CM | POA: Diagnosis not present

## 2019-04-04 DIAGNOSIS — Z23 Encounter for immunization: Secondary | ICD-10-CM | POA: Diagnosis not present

## 2019-04-04 DIAGNOSIS — W19XXXA Unspecified fall, initial encounter: Secondary | ICD-10-CM | POA: Diagnosis not present

## 2019-04-05 ENCOUNTER — Other Ambulatory Visit: Payer: Self-pay | Admitting: Cardiovascular Disease

## 2019-04-05 DIAGNOSIS — W19XXXA Unspecified fall, initial encounter: Secondary | ICD-10-CM | POA: Diagnosis not present

## 2019-04-05 DIAGNOSIS — I351 Nonrheumatic aortic (valve) insufficiency: Secondary | ICD-10-CM | POA: Diagnosis not present

## 2019-04-05 DIAGNOSIS — F0390 Unspecified dementia without behavioral disturbance: Secondary | ICD-10-CM | POA: Diagnosis not present

## 2019-04-05 DIAGNOSIS — I10 Essential (primary) hypertension: Secondary | ICD-10-CM | POA: Diagnosis not present

## 2019-04-05 DIAGNOSIS — I340 Nonrheumatic mitral (valve) insufficiency: Secondary | ICD-10-CM | POA: Diagnosis not present

## 2019-04-05 DIAGNOSIS — I361 Nonrheumatic tricuspid (valve) insufficiency: Secondary | ICD-10-CM | POA: Diagnosis not present

## 2019-04-05 DIAGNOSIS — I4589 Other specified conduction disorders: Secondary | ICD-10-CM | POA: Diagnosis not present

## 2019-04-05 DIAGNOSIS — I4891 Unspecified atrial fibrillation: Secondary | ICD-10-CM | POA: Diagnosis not present

## 2019-04-06 DIAGNOSIS — I4589 Other specified conduction disorders: Secondary | ICD-10-CM | POA: Diagnosis not present

## 2019-04-06 DIAGNOSIS — I4891 Unspecified atrial fibrillation: Secondary | ICD-10-CM | POA: Diagnosis not present

## 2019-04-06 DIAGNOSIS — W19XXXA Unspecified fall, initial encounter: Secondary | ICD-10-CM | POA: Diagnosis not present

## 2019-04-06 DIAGNOSIS — I10 Essential (primary) hypertension: Secondary | ICD-10-CM | POA: Diagnosis not present

## 2019-04-06 DIAGNOSIS — F0390 Unspecified dementia without behavioral disturbance: Secondary | ICD-10-CM | POA: Diagnosis not present

## 2019-04-12 ENCOUNTER — Ambulatory Visit (INDEPENDENT_AMBULATORY_CARE_PROVIDER_SITE_OTHER): Payer: Medicare Other | Admitting: Physician Assistant

## 2019-04-12 ENCOUNTER — Other Ambulatory Visit: Payer: Self-pay

## 2019-04-12 ENCOUNTER — Encounter: Payer: Self-pay | Admitting: Physician Assistant

## 2019-04-12 ENCOUNTER — Encounter (INDEPENDENT_AMBULATORY_CARE_PROVIDER_SITE_OTHER): Payer: Self-pay

## 2019-04-12 ENCOUNTER — Telehealth: Payer: Self-pay | Admitting: Physician Assistant

## 2019-04-12 VITALS — BP 104/54 | HR 80 | Ht 64.0 in | Wt 124.0 lb

## 2019-04-12 DIAGNOSIS — I351 Nonrheumatic aortic (valve) insufficiency: Secondary | ICD-10-CM

## 2019-04-12 DIAGNOSIS — I340 Nonrheumatic mitral (valve) insufficiency: Secondary | ICD-10-CM | POA: Diagnosis not present

## 2019-04-12 DIAGNOSIS — I5032 Chronic diastolic (congestive) heart failure: Secondary | ICD-10-CM

## 2019-04-12 DIAGNOSIS — I471 Supraventricular tachycardia: Secondary | ICD-10-CM | POA: Diagnosis not present

## 2019-04-12 DIAGNOSIS — I071 Rheumatic tricuspid insufficiency: Secondary | ICD-10-CM | POA: Diagnosis not present

## 2019-04-12 DIAGNOSIS — I482 Chronic atrial fibrillation, unspecified: Secondary | ICD-10-CM | POA: Diagnosis not present

## 2019-04-12 DIAGNOSIS — I10 Essential (primary) hypertension: Secondary | ICD-10-CM

## 2019-04-12 NOTE — Telephone Encounter (Signed)
Call placed to pt re: stopping Coumadin.  Spoke with [name] and [name], daughter, DPR on file for both. They will make sure PCP is made aware as well.

## 2019-04-12 NOTE — Progress Notes (Signed)
Cardiology Office Note    Date:  04/12/2019  ID:  [name], DOB 12/07/1924, MRN [id number] PCP:  Nicoletta Dress, MD  Cardiologist:  Lauree Chandler, MD   Chief Complaint: f/u atrial fib, CHF  History of Present Illness:  [name] is a 85 y.o. female with history of chronic atrial fibrillation, HTN, mild-moderate AI, mild MR, moderate TR by echo 4332, chronic diastolic CHF, dementia, HLD (followed by primary care) who presents for routine 6 month follow-up. Prior echo 09/2010 showed mild LVH, EF 55-60%, mild-moderate AI and mild MR, moderate-severe LAE, moderate RAE, moderate TR. Last labs 12/2018 showed A1C 5.7, alk phos 136, AST/ALT wnl, BUN 12, Cr 1, Hgb 12.6, Hct 36.9, LDL 70, 2017 TSH wnl. INR is followed by PCP.  She returns for routine follow-up accompanied by her husband. They got married in 1948. Daughter Jana Half assists on the phone. The patient was reported to have been admitted at [facility] with a fall recently and saw Dr. Geraldo Pitter while admitted. She had to have stitches in the back of her head. She has poor memory so isn't sure how she fell. Jana Half reports CT scan was OK. Daughter reports HR was in the 40s-140s. They decreased her metoprolol to 75m BID and added Diltiazem 1865mdaily. She also had 3 falls back in January as well. She has had general failure to thrive for quite some time with poor nutrition/appetite, poor oral liquid intake and ongoing weight loss. No CP or SOB. D[place]    Past Medical History:  Diagnosis Date  . Aortic insufficiency   . Chronic atrial fibrillation    echo 9/09 EF 60%, mild MR  . Cognitive deficits 08/07/2013  . D[place]  . Diabetes mellitus   . HTN (hypertension)   . Hyperlipidemia   . Mild mitral regurgitation   . Moderate tricuspid regurgitation     Past Surgical History:  Procedure Laterality Date  . APPENDECTOMY    . SHOULDER SURGERY     Bilateral     Current Medications: Current Meds  Medication Sig  . atorvastatin (LIPITOR) 40 MG tablet Take 40 mg by mouth at bedtime.  . Marland Kitcheniltiazem (CARDIZEM CD) 180 MG 24 hr capsule Take 180 mg by mouth daily.  . Marland Kitchenonepezil (ARICEPT) 5 MG tablet TAKE 1 TABLET BY MOUTH EVERY NIGHT  . fish oil-omega-3 fatty acids 1000 MG capsule Take 1 g by mouth daily.    . furosemide (LASIX) 40 MG tablet TAKE 1 TABLET BY MOUTH EVERY DAY  . KLOR-CON M20 20 MEQ tablet TAKE 2 TABLETS (40 MEQ TOTAL) BY MOUTH DAILY.  . Marland Kitchenosartan (COZAAR) 25 MG tablet TAKE 1 TABLET BY MOUTH EVERY DAY  . memantine (NAMENDA) 10 MG tablet Take 1 tablet (10 mg total) by mouth 2 (two) times daily.  . metoprolol tartrate (LOPRESSOR) 100 MG tablet TAKE 1 TABLET (100 MG TOTAL) BY MOUTH 2 (TWO) TIMES DAILY.  . Marland Kitchenarfarin (COUMADIN) 2 MG tablet Take 2 mg by mouth daily. 2.5 mg on Sunday & Monday, 2 mg Tuesday, Wednesday, Thursday, Friday, Saturday.     Allergies:   Aspirin   Social History   Socioeconomic History  . Marital status: Married    Spouse name: Micheal  . Number of children: 3  . Years of education: 1241. Highest education level: Not on file  Occupational History  . Occupation: Retired-part time in doProgrammer, multimediaRETIRED  Social  Needs  . Financial resource strain: Not on file  . Food insecurity    Worry: Not on file    Inability: Not on file  . Transportation needs    Medical: Not on file    Non-medical: Not on file  Tobacco Use  . Smoking status: Former Smoker    Packs/day: 0.20    Years: 40.00    Pack years: 8.00    Types: Cigarettes    Quit date: 10/03/1982    Years since quitting: 36.5  . Smokeless tobacco: Never Used  Substance and Sexual Activity  . Alcohol use: No  . Drug use: No  . Sexual activity: Not on file  Lifestyle  . Physical activity    Days per week: Not on file    Minutes per session: Not on file  . Stress: Not on file  Relationships  . Social Herbalist on phone: Not on  file    Gets together: Not on file    Attends religious service: Not on file    Active member of club or organization: Not on file    Attends meetings of clubs or organizations: Not on file    Relationship status: Not on file  Other Topics Concern  . Not on file  Social History Narrative   Married, exercises regularly. Her PCP is Dr. Truman Hayward, cardiologist is Dr. Tempie Hoist. She lives independently in a private home, is married.     Her husband is here with her.    The patient is 30, born with Erb's Palsy, has three adult children, two daughters and a son, 65 years younger than his sisters. The patient drinks caffeinated beverages, is a non-smoker, non-drinker, handedness: ambidexterous     Family History:  The patient's family history includes Cancer in her father, mother, and another family member.  ROS:   Please see the history of present illness.  All other systems are reviewed and otherwise negative.    PHYSICAL EXAM:   VS:  BP (!) 104/54   Pulse 80   Ht 5' 4"  (1.626 m)   Wt 124 lb (56.2 kg)   SpO2 98%   BMI 21.28 kg/m   BMI: Body mass index is 21.28 kg/m. GEN: Frail elderly WF in no acute distress HEENT: normocephalic, atraumatic Neck: no JVD, carotid bruits, or masses Cardiac: RRR; no murmurs, rubs, or gallops, no edema  Respiratory:  clear to auscultation bilaterally, normal work of breathing GI: soft, nontender, nondistended, + BS MS: no deformity or atrophy Skin: warm and dry, no rash Neuro:  Alert and Oriented x 3, Strength and sensation are intact, follows commands Psych: euthymic mood, full affect  Wt Readings from Last 3 Encounters:  04/12/19 124 lb (56.2 kg)  11/01/18 131 lb 9.6 oz (59.7 kg)  10/16/18 128 lb (58.1 kg)      Studies/Labs Reviewed:   EKG:  EKG was ordered today and personally reviewed by me and demonstrates ectopic atrial tachycardia with 2:1 block (reviewed with Dr. Tamala Julian)  Recent Labs: No results found for requested labs within last 8760  hours.   Lipid Panel No results found for: CHOL, TRIG, HDL, CHOLHDL, VLDL, LDLCALC, LDLDIRECT  Additional studies/ records that were reviewed today include: Summarized above  ASSESSMENT & PLAN:   1. Chronic atrial fibrillation - meds recently adjusted by cardiologist in [place] while in the hospital. Daughter reports HR was in the 40s-140s. Rate is now well controlled. Her EKG was reviewed with Dr. Tamala Julian today who feels  this represents ectopic atrial tach with 2:1 block.  In absence of any cardiac symptoms, we will monitor conservatively for now. I suspect she is now a poor anticoagulation candidate given 4 falls this year including one hitting her head. I will reach out to Dr. Angelena Form to inquire if he recommend discontinuing Coumadin and following without further workup at this time. If she develops any episodes of syncope could consider event monitoring but she seems to be a poor candidate for PPM even a tachy-brady syndrome were noted. Will try to get records from [place]. 2. Mild-moderate AI, Mild MR, Moderate TR - no symptoms of CP, SOB or syncope. Conservative management recommended, without indication for further imaging at this time. 3. Chronic diastolic CHF - appears euvolemic on exam. If soft BP/falling remains an issue, can consider decreasing furosemide dose. Close f/u with PCP encouraged. She has f/u with them next week. 4. Essential HTN - BP is on the softer side which Jana Half says is common for the patient. Stop losartan today. F/u primary care.  Disposition: F/u with Dr. Geraldo Pitter in 6 months (per daughter's request due to proximity) - pt/husband agreeable.  Medication Adjustments/Labs and Tests Ordered: Current medicines are reviewed at length with the patient today.  Concerns regarding medicines are outlined above. Medication changes, Labs and Tests ordered today are summarized above and listed in the Patient Instructions accessible in Encounters.   Signed, Charlie Pitter, PA-C   04/12/2019 3:15 PM    [facility] [place], [place], [place]  [address] Phone: [phone]; Fax: [phone]

## 2019-04-12 NOTE — Telephone Encounter (Addendum)
   Anderson Malta, Please let pt's daughter know that Dr. Angelena Form agrees it's time to stop Coumadin due to her recent falls, advancing age, poor oral intake and dementia. He agrees with plan we discussed today. She should let PCP know since they manage her INR. He is fine with her f/u with Dr. Geraldo Pitter given that they live closer to [facility]. Will cc to Dr. Geraldo Pitter to get the final OK to make the switch. Emilyn Ruble PA-C

## 2019-04-12 NOTE — Patient Instructions (Addendum)
Medication Instructions:  Your physician has recommended you make the following change in your medication:  1.  STOP the [place]  If you need a refill on your cardiac medications before your next appointment, please call your pharmacy.   Lab work: None ordered  If you have labs (blood work) drawn today and your tests are completely normal, you will receive your results only by: Marland Kitchen MyChart Message (if you have MyChart) OR . A paper copy in the mail If you have any lab test that is abnormal or we need to change your treatment, we will call you to review the results.  Testing/Procedures: None ordered   Follow-Up: At [facility], you and your health needs are our priority.  As part of our continuing mission to provide you with exceptional heart care, we have created designated Provider Care Teams.  These Care Teams include your primary Cardiologist (physician) and Advanced Practice Providers (APPs -  Physician Assistants and Nurse Practitioners) who all work together to provide you with the care you need, when you need it. You will need a follow up appointment in 6 months.  Please call our office 2 months in advance to schedule this appointment.  You may see Lauree Chandler, MD or another member of our [place] Provider Team in [place]: Jenne Campus, MD . Shirlee More, MD  Any Other Special Instructions Will Be Listed Below (If Applicable). We will call you about stopping the Coumadin

## 2019-04-12 NOTE — Telephone Encounter (Signed)
New Message    Unable to leave message, called home and mobile numbers and no voicemail available.  Called to confirm appt and answer covid questions

## 2019-04-16 DIAGNOSIS — R7301 Impaired fasting glucose: Secondary | ICD-10-CM | POA: Diagnosis not present

## 2019-04-16 DIAGNOSIS — Z79899 Other long term (current) drug therapy: Secondary | ICD-10-CM | POA: Diagnosis not present

## 2019-04-16 DIAGNOSIS — I4891 Unspecified atrial fibrillation: Secondary | ICD-10-CM | POA: Diagnosis not present

## 2019-04-16 DIAGNOSIS — R296 Repeated falls: Secondary | ICD-10-CM | POA: Diagnosis not present

## 2019-04-16 DIAGNOSIS — E785 Hyperlipidemia, unspecified: Secondary | ICD-10-CM | POA: Diagnosis not present

## 2019-04-16 DIAGNOSIS — I10 Essential (primary) hypertension: Secondary | ICD-10-CM | POA: Diagnosis not present

## 2019-05-10 ENCOUNTER — Other Ambulatory Visit: Payer: Self-pay | Admitting: Cardiovascular Disease

## 2019-05-14 DIAGNOSIS — Z7901 Long term (current) use of anticoagulants: Secondary | ICD-10-CM | POA: Diagnosis not present

## 2019-05-14 DIAGNOSIS — I10 Essential (primary) hypertension: Secondary | ICD-10-CM | POA: Diagnosis not present

## 2019-05-14 DIAGNOSIS — Z79899 Other long term (current) drug therapy: Secondary | ICD-10-CM | POA: Diagnosis not present

## 2019-05-14 DIAGNOSIS — E119 Type 2 diabetes mellitus without complications: Secondary | ICD-10-CM | POA: Diagnosis not present

## 2019-05-14 DIAGNOSIS — F0390 Unspecified dementia without behavioral disturbance: Secondary | ICD-10-CM | POA: Diagnosis not present

## 2019-05-14 DIAGNOSIS — I480 Paroxysmal atrial fibrillation: Secondary | ICD-10-CM | POA: Diagnosis not present

## 2019-05-16 DIAGNOSIS — I4891 Unspecified atrial fibrillation: Secondary | ICD-10-CM | POA: Diagnosis not present

## 2019-05-21 ENCOUNTER — Encounter: Payer: Self-pay | Admitting: Cardiology

## 2019-05-21 ENCOUNTER — Ambulatory Visit (INDEPENDENT_AMBULATORY_CARE_PROVIDER_SITE_OTHER): Payer: Medicare Other | Admitting: Cardiology

## 2019-05-21 ENCOUNTER — Other Ambulatory Visit: Payer: Self-pay

## 2019-05-21 VITALS — BP 124/64 | HR 68 | Resp 14 | Ht 64.0 in | Wt 125.8 lb

## 2019-05-21 DIAGNOSIS — I10 Essential (primary) hypertension: Secondary | ICD-10-CM | POA: Diagnosis not present

## 2019-05-21 DIAGNOSIS — I351 Nonrheumatic aortic (valve) insufficiency: Secondary | ICD-10-CM | POA: Diagnosis not present

## 2019-05-21 DIAGNOSIS — I4821 Permanent atrial fibrillation: Secondary | ICD-10-CM | POA: Diagnosis not present

## 2019-05-21 NOTE — Patient Instructions (Signed)
Medication Instructions:  Your physician recommends that you continue on your current medications as directed. Please refer to the Current Medication list given to you today.  If you need a refill on your cardiac medications before your next appointment, please call your pharmacy.   Lab work: NONE  If you have labs (blood work) drawn today and your tests are completely normal, you will receive your results only by: Marland Kitchen MyChart Message (if you have MyChart) OR . A paper copy in the mail If you have any lab test that is abnormal or we need to change your treatment, we will call you to review the results.  Testing/Procedures: NONE  Follow-Up: At [facility], you and your health needs are our priority.  As part of our continuing mission to provide you with exceptional heart care, we have created designated Provider Care Teams.  These Care Teams include your primary Cardiologist (physician) and Advanced Practice Providers (APPs -  Physician Assistants and Nurse Practitioners) who all work together to provide you with the care you need, when you need it. You will need a follow up appointment in 2 months.  Please call our office 2 months in advance to schedule this appointment.  You may see Lauree Chandler, MD or another member of our [place] Provider Team in [place]: Shirlee More, MD . Jyl Heinz, MD  Any Other Special Instructions Will Be Listed Below (If Applicable).

## 2019-05-21 NOTE — Progress Notes (Signed)
Cardiology Office Note:    Date:  05/21/2019   ID:  [name], DOB Dec 07, 1924, MRN [id number]  PCP:  Nicoletta Dress, MD  Cardiologist:  Jenne Campus, MD    Referring MD: Nicoletta Dress, MD   Chief Complaint  Patient presents with  . Follow-up  I am doing fine  History of Present Illness:    [name] is a 85 y.o. female with permanent atrial fibrillation, aortic insufficiency, diabetes, dementia recently she ended up being in the emergency room because of fast heartbeats.  However first documented vital signs in the emergency room showed heart rate 65.  She was eventually discharged home.  And doing well denies have any complaints she comes to our office with her husband because of her dementia she does not remember too many details.  He said that she is doing fine.  She is to be on Coumadin but she is not on it and I do not have good explanation for it.  I suspect dementia and have fragility contribute to the fact that this medication has been withdrawn.  We will contact her primary care physician try to clarify that.  I do think she is a candidate for a watchman device because of her advanced age and dementia  Past Medical History:  Diagnosis Date  . Aortic insufficiency   . Chronic atrial fibrillation    echo 9/09 EF 60%, mild MR  . Cognitive deficits 08/07/2013  . Dementia ([place])   . Diabetes mellitus   . Ectopic atrial tachycardia ([place])   . HTN (hypertension)   . Hyperlipidemia   . Mild mitral regurgitation   . Moderate tricuspid regurgitation     Past Surgical History:  Procedure Laterality Date  . APPENDECTOMY    . SHOULDER SURGERY     Bilateral    Current Medications: Current Meds  Medication Sig  . atorvastatin (LIPITOR) 40 MG tablet Take 40 mg by mouth at bedtime.  Marland Kitchen diltiazem (CARDIZEM CD) 180 MG 24 hr capsule Take 180 mg by mouth daily.  Marland Kitchen donepezil (ARICEPT) 5 MG tablet TAKE 1 TABLET BY MOUTH EVERY NIGHT  . fish oil-omega-3 fatty acids  1000 MG capsule Take 1 g by mouth daily.    . furosemide (LASIX) 40 MG tablet TAKE 1 TABLET BY MOUTH EVERY DAY  . KLOR-CON M20 20 MEQ tablet TAKE 2 TABLETS BY MOUTH DAILY.  . memantine (NAMENDA) 10 MG tablet Take 1 tablet (10 mg total) by mouth 2 (two) times daily.  . metoprolol tartrate (LOPRESSOR) 25 MG tablet Take 25 mg by mouth 2 (two) times daily.     Allergies:   Aspirin   Social History   Socioeconomic History  . Marital status: Married    Spouse name: Micheal  . Number of children: 3  . Years of education: 70  . Highest education level: Not on file  Occupational History  . Occupation: Retired-part time in Programmer, multimedia: RETIRED  Social Needs  . Financial resource strain: Not on file  . Food insecurity    Worry: Not on file    Inability: Not on file  . Transportation needs    Medical: Not on file    Non-medical: Not on file  Tobacco Use  . Smoking status: Former Smoker    Packs/day: 0.20    Years: 40.00    Pack years: 8.00    Types: Cigarettes    Quit date: 10/03/1982    Years since quitting:  36.6  . Smokeless tobacco: Never Used  Substance and Sexual Activity  . Alcohol use: No  . Drug use: No  . Sexual activity: Not on file  Lifestyle  . Physical activity    Days per week: Not on file    Minutes per session: Not on file  . Stress: Not on file  Relationships  . Social Herbalist on phone: Not on file    Gets together: Not on file    Attends religious service: Not on file    Active member of club or organization: Not on file    Attends meetings of clubs or organizations: Not on file    Relationship status: Not on file  Other Topics Concern  . Not on file  Social History Narrative   Married, exercises regularly. Her PCP is Dr. Truman Hayward, cardiologist is Dr. Tempie Hoist. She lives independently in a private home, is married.     Her husband is here with her.    The patient is 51, born with Erb's Palsy, has three adult children, two  daughters and a son, 64 years younger than his sisters. The patient drinks caffeinated beverages, is a non-smoker, non-drinker, handedness: ambidexterous     Family History: The patient's family history includes Cancer in her father, mother, and another family member. ROS:   Please see the history of present illness.    All 14 point review of systems negative except as described per history of present illness  EKGs/Labs/Other Studies Reviewed:      Recent Labs: No results found for requested labs within last 8760 hours.  Recent Lipid Panel No results found for: CHOL, TRIG, HDL, CHOLHDL, VLDL, LDLCALC, LDLDIRECT  Physical Exam:    VS:  BP 124/64   Pulse 68   Resp 14   Ht 5\' 4"  (1.626 m)   Wt 125 lb 12.8 oz (57.1 kg)   BMI 21.59 kg/m     Wt Readings from Last 3 Encounters:  05/21/19 125 lb 12.8 oz (57.1 kg)  04/12/19 124 lb (56.2 kg)  11/01/18 131 lb 9.6 oz (59.7 kg)     GEN:  Well nourished, well developed in no acute distress HEENT: Normal NECK: No JVD; No carotid bruits LYMPHATICS: No lymphadenopathy CARDIAC: Irregularly irregular, no murmurs, no rubs, no gallops RESPIRATORY:  Clear to auscultation without rales, wheezing or rhonchi  ABDOMEN: Soft, non-tender, non-distended MUSCULOSKELETAL:  No edema; No deformity  SKIN: Warm and dry LOWER EXTREMITIES: no swelling NEUROLOGIC:  Alert and oriented x 3 PSYCHIATRIC:  Normal affect   ASSESSMENT:    1. Permanent atrial fibrillation   2. HYPERTENSION, BENIGN   3. Nonrheumatic aortic valve insufficiency    PLAN:    In order of problems listed above:  1. Permanent atrial fibrillation.  EKG will be done to establish at the rhythm as well as check for heart rate.  Will contact primary care physician to talk about the coagulation again because of her fragility I be reluctant to continue. 2. Essential hypertension blood pressure well controlled 3. Normal aortic valve insufficiency because of her advanced age and  overfolded factors she is asymptomatic I will continue conservative approach.   Medication Adjustments/Labs and Tests Ordered: Current medicines are reviewed at length with the patient today.  Concerns regarding medicines are outlined above.  No orders of the defined types were placed in this encounter.  Medication changes: No orders of the defined types were placed in this encounter.   Signed, Park Liter,  MD, [facility] 05/21/2019 4:45 PM    [facility]

## 2019-05-22 ENCOUNTER — Other Ambulatory Visit: Payer: Self-pay | Admitting: Neurology

## 2019-05-22 DIAGNOSIS — G301 Alzheimer's disease with late onset: Secondary | ICD-10-CM

## 2019-05-22 DIAGNOSIS — G3184 Mild cognitive impairment, so stated: Secondary | ICD-10-CM

## 2019-05-22 DIAGNOSIS — F0280 Dementia in other diseases classified elsewhere without behavioral disturbance: Secondary | ICD-10-CM

## 2019-05-27 DIAGNOSIS — R51 Headache: Secondary | ICD-10-CM | POA: Diagnosis not present

## 2019-05-27 DIAGNOSIS — R42 Dizziness and giddiness: Secondary | ICD-10-CM | POA: Diagnosis not present

## 2019-06-05 DIAGNOSIS — R410 Disorientation, unspecified: Secondary | ICD-10-CM | POA: Diagnosis not present

## 2019-06-05 DIAGNOSIS — R440 Auditory hallucinations: Secondary | ICD-10-CM | POA: Diagnosis not present

## 2019-06-05 DIAGNOSIS — F0150 Vascular dementia without behavioral disturbance: Secondary | ICD-10-CM | POA: Diagnosis not present

## 2019-06-06 DIAGNOSIS — F0391 Unspecified dementia with behavioral disturbance: Secondary | ICD-10-CM | POA: Diagnosis not present

## 2019-06-06 DIAGNOSIS — I4892 Unspecified atrial flutter: Secondary | ICD-10-CM | POA: Diagnosis not present

## 2019-06-06 DIAGNOSIS — N179 Acute kidney failure, unspecified: Secondary | ICD-10-CM | POA: Diagnosis not present

## 2019-06-06 DIAGNOSIS — M7989 Other specified soft tissue disorders: Secondary | ICD-10-CM | POA: Diagnosis not present

## 2019-06-06 DIAGNOSIS — R410 Disorientation, unspecified: Secondary | ICD-10-CM | POA: Diagnosis not present

## 2019-06-06 DIAGNOSIS — W19XXXA Unspecified fall, initial encounter: Secondary | ICD-10-CM | POA: Diagnosis not present

## 2019-06-06 DIAGNOSIS — E871 Hypo-osmolality and hyponatremia: Secondary | ICD-10-CM | POA: Diagnosis not present

## 2019-06-06 DIAGNOSIS — R000 Tachycardia, unspecified: Secondary | ICD-10-CM | POA: Diagnosis not present

## 2019-06-06 DIAGNOSIS — E860 Dehydration: Secondary | ICD-10-CM | POA: Diagnosis not present

## 2019-06-06 DIAGNOSIS — I4891 Unspecified atrial fibrillation: Secondary | ICD-10-CM | POA: Diagnosis not present

## 2019-06-06 DIAGNOSIS — R4182 Altered mental status, unspecified: Secondary | ICD-10-CM | POA: Diagnosis not present

## 2019-06-06 DIAGNOSIS — Z03818 Encounter for observation for suspected exposure to other biological agents ruled out: Secondary | ICD-10-CM | POA: Diagnosis not present

## 2019-06-06 DIAGNOSIS — F05 Delirium due to known physiological condition: Secondary | ICD-10-CM | POA: Diagnosis not present

## 2019-06-07 DIAGNOSIS — E860 Dehydration: Secondary | ICD-10-CM | POA: Diagnosis not present

## 2019-06-07 DIAGNOSIS — F05 Delirium due to known physiological condition: Secondary | ICD-10-CM | POA: Diagnosis not present

## 2019-06-07 DIAGNOSIS — I4891 Unspecified atrial fibrillation: Secondary | ICD-10-CM | POA: Diagnosis not present

## 2019-06-07 DIAGNOSIS — F0391 Unspecified dementia with behavioral disturbance: Secondary | ICD-10-CM | POA: Diagnosis not present

## 2019-06-07 DIAGNOSIS — E871 Hypo-osmolality and hyponatremia: Secondary | ICD-10-CM | POA: Diagnosis not present

## 2019-06-07 DIAGNOSIS — N179 Acute kidney failure, unspecified: Secondary | ICD-10-CM | POA: Diagnosis not present

## 2019-06-07 DIAGNOSIS — R4182 Altered mental status, unspecified: Secondary | ICD-10-CM | POA: Diagnosis not present

## 2019-06-07 DIAGNOSIS — I4892 Unspecified atrial flutter: Secondary | ICD-10-CM | POA: Diagnosis not present

## 2019-06-08 DIAGNOSIS — E785 Hyperlipidemia, unspecified: Secondary | ICD-10-CM | POA: Diagnosis not present

## 2019-06-08 DIAGNOSIS — I4891 Unspecified atrial fibrillation: Secondary | ICD-10-CM | POA: Diagnosis present

## 2019-06-08 DIAGNOSIS — F05 Delirium due to known physiological condition: Secondary | ICD-10-CM | POA: Diagnosis present

## 2019-06-08 DIAGNOSIS — Z23 Encounter for immunization: Secondary | ICD-10-CM | POA: Diagnosis not present

## 2019-06-08 DIAGNOSIS — I10 Essential (primary) hypertension: Secondary | ICD-10-CM | POA: Diagnosis not present

## 2019-06-08 DIAGNOSIS — U071 COVID-19: Secondary | ICD-10-CM | POA: Diagnosis not present

## 2019-06-08 DIAGNOSIS — I4892 Unspecified atrial flutter: Secondary | ICD-10-CM | POA: Diagnosis present

## 2019-06-08 DIAGNOSIS — F0391 Unspecified dementia with behavioral disturbance: Secondary | ICD-10-CM | POA: Diagnosis present

## 2019-06-08 DIAGNOSIS — R4182 Altered mental status, unspecified: Secondary | ICD-10-CM | POA: Diagnosis not present

## 2019-06-08 DIAGNOSIS — R441 Visual hallucinations: Secondary | ICD-10-CM | POA: Diagnosis not present

## 2019-06-08 DIAGNOSIS — R0902 Hypoxemia: Secondary | ICD-10-CM | POA: Diagnosis not present

## 2019-06-08 DIAGNOSIS — R440 Auditory hallucinations: Secondary | ICD-10-CM | POA: Diagnosis not present

## 2019-06-08 DIAGNOSIS — Z79899 Other long term (current) drug therapy: Secondary | ICD-10-CM | POA: Diagnosis not present

## 2019-06-08 DIAGNOSIS — Z885 Allergy status to narcotic agent status: Secondary | ICD-10-CM | POA: Diagnosis not present

## 2019-06-08 DIAGNOSIS — I5033 Acute on chronic diastolic (congestive) heart failure: Secondary | ICD-10-CM | POA: Diagnosis not present

## 2019-06-08 DIAGNOSIS — F0390 Unspecified dementia without behavioral disturbance: Secondary | ICD-10-CM | POA: Diagnosis not present

## 2019-06-08 DIAGNOSIS — E871 Hypo-osmolality and hyponatremia: Secondary | ICD-10-CM | POA: Diagnosis present

## 2019-06-08 DIAGNOSIS — R001 Bradycardia, unspecified: Secondary | ICD-10-CM | POA: Diagnosis not present

## 2019-06-08 DIAGNOSIS — J90 Pleural effusion, not elsewhere classified: Secondary | ICD-10-CM | POA: Diagnosis not present

## 2019-06-08 DIAGNOSIS — I509 Heart failure, unspecified: Secondary | ICD-10-CM | POA: Diagnosis not present

## 2019-06-08 DIAGNOSIS — M2550 Pain in unspecified joint: Secondary | ICD-10-CM | POA: Diagnosis not present

## 2019-06-08 DIAGNOSIS — E119 Type 2 diabetes mellitus without complications: Secondary | ICD-10-CM | POA: Diagnosis present

## 2019-06-08 DIAGNOSIS — I110 Hypertensive heart disease with heart failure: Secondary | ICD-10-CM | POA: Diagnosis present

## 2019-06-08 DIAGNOSIS — Z7401 Bed confinement status: Secondary | ICD-10-CM | POA: Diagnosis not present

## 2019-06-08 DIAGNOSIS — N179 Acute kidney failure, unspecified: Secondary | ICD-10-CM | POA: Diagnosis present

## 2019-06-08 DIAGNOSIS — E860 Dehydration: Secondary | ICD-10-CM | POA: Diagnosis present

## 2019-06-08 DIAGNOSIS — R410 Disorientation, unspecified: Secondary | ICD-10-CM | POA: Diagnosis not present

## 2019-06-09 DIAGNOSIS — F05 Delirium due to known physiological condition: Secondary | ICD-10-CM | POA: Diagnosis not present

## 2019-06-09 DIAGNOSIS — F0391 Unspecified dementia with behavioral disturbance: Secondary | ICD-10-CM | POA: Diagnosis not present

## 2019-06-09 DIAGNOSIS — R4182 Altered mental status, unspecified: Secondary | ICD-10-CM | POA: Diagnosis not present

## 2019-06-09 DIAGNOSIS — I4892 Unspecified atrial flutter: Secondary | ICD-10-CM | POA: Diagnosis not present

## 2019-06-10 DIAGNOSIS — R4182 Altered mental status, unspecified: Secondary | ICD-10-CM | POA: Diagnosis not present

## 2019-06-10 DIAGNOSIS — I4892 Unspecified atrial flutter: Secondary | ICD-10-CM | POA: Diagnosis not present

## 2019-06-10 DIAGNOSIS — F0391 Unspecified dementia with behavioral disturbance: Secondary | ICD-10-CM | POA: Diagnosis not present

## 2019-06-10 DIAGNOSIS — F05 Delirium due to known physiological condition: Secondary | ICD-10-CM | POA: Diagnosis not present

## 2019-06-11 DIAGNOSIS — R4182 Altered mental status, unspecified: Secondary | ICD-10-CM | POA: Diagnosis not present

## 2019-06-11 DIAGNOSIS — I4892 Unspecified atrial flutter: Secondary | ICD-10-CM | POA: Diagnosis not present

## 2019-06-11 DIAGNOSIS — F0391 Unspecified dementia with behavioral disturbance: Secondary | ICD-10-CM | POA: Diagnosis not present

## 2019-06-11 DIAGNOSIS — F05 Delirium due to known physiological condition: Secondary | ICD-10-CM | POA: Diagnosis not present

## 2019-06-12 DIAGNOSIS — F0391 Unspecified dementia with behavioral disturbance: Secondary | ICD-10-CM | POA: Diagnosis not present

## 2019-06-12 DIAGNOSIS — F05 Delirium due to known physiological condition: Secondary | ICD-10-CM | POA: Diagnosis not present

## 2019-06-12 DIAGNOSIS — R4182 Altered mental status, unspecified: Secondary | ICD-10-CM | POA: Diagnosis not present

## 2019-06-12 DIAGNOSIS — I4892 Unspecified atrial flutter: Secondary | ICD-10-CM | POA: Diagnosis not present

## 2019-06-13 DIAGNOSIS — R4182 Altered mental status, unspecified: Secondary | ICD-10-CM | POA: Diagnosis not present

## 2019-06-13 DIAGNOSIS — I4892 Unspecified atrial flutter: Secondary | ICD-10-CM | POA: Diagnosis not present

## 2019-06-13 DIAGNOSIS — F05 Delirium due to known physiological condition: Secondary | ICD-10-CM | POA: Diagnosis not present

## 2019-06-13 DIAGNOSIS — F0391 Unspecified dementia with behavioral disturbance: Secondary | ICD-10-CM | POA: Diagnosis not present

## 2019-06-14 DIAGNOSIS — I4892 Unspecified atrial flutter: Secondary | ICD-10-CM | POA: Diagnosis not present

## 2019-06-14 DIAGNOSIS — F0391 Unspecified dementia with behavioral disturbance: Secondary | ICD-10-CM | POA: Diagnosis not present

## 2019-06-14 DIAGNOSIS — F05 Delirium due to known physiological condition: Secondary | ICD-10-CM | POA: Diagnosis not present

## 2019-06-14 DIAGNOSIS — R4182 Altered mental status, unspecified: Secondary | ICD-10-CM | POA: Diagnosis not present

## 2019-06-14 DIAGNOSIS — I10 Essential (primary) hypertension: Secondary | ICD-10-CM | POA: Diagnosis not present

## 2019-06-14 DIAGNOSIS — F0390 Unspecified dementia without behavioral disturbance: Secondary | ICD-10-CM

## 2019-06-15 DIAGNOSIS — I4892 Unspecified atrial flutter: Secondary | ICD-10-CM | POA: Diagnosis not present

## 2019-06-15 DIAGNOSIS — F0391 Unspecified dementia with behavioral disturbance: Secondary | ICD-10-CM | POA: Diagnosis not present

## 2019-06-15 DIAGNOSIS — F0390 Unspecified dementia without behavioral disturbance: Secondary | ICD-10-CM | POA: Diagnosis not present

## 2019-06-15 DIAGNOSIS — I10 Essential (primary) hypertension: Secondary | ICD-10-CM | POA: Diagnosis not present

## 2019-06-15 DIAGNOSIS — F05 Delirium due to known physiological condition: Secondary | ICD-10-CM | POA: Diagnosis not present

## 2019-06-15 DIAGNOSIS — R4182 Altered mental status, unspecified: Secondary | ICD-10-CM | POA: Diagnosis not present

## 2019-06-16 DIAGNOSIS — R4182 Altered mental status, unspecified: Secondary | ICD-10-CM | POA: Diagnosis not present

## 2019-06-16 DIAGNOSIS — F05 Delirium due to known physiological condition: Secondary | ICD-10-CM | POA: Diagnosis not present

## 2019-06-16 DIAGNOSIS — F0390 Unspecified dementia without behavioral disturbance: Secondary | ICD-10-CM | POA: Diagnosis not present

## 2019-06-16 DIAGNOSIS — I10 Essential (primary) hypertension: Secondary | ICD-10-CM | POA: Diagnosis not present

## 2019-06-16 DIAGNOSIS — I509 Heart failure, unspecified: Secondary | ICD-10-CM | POA: Diagnosis not present

## 2019-06-16 DIAGNOSIS — I4892 Unspecified atrial flutter: Secondary | ICD-10-CM | POA: Diagnosis not present

## 2019-06-16 DIAGNOSIS — F0391 Unspecified dementia with behavioral disturbance: Secondary | ICD-10-CM | POA: Diagnosis not present

## 2019-06-17 DIAGNOSIS — F05 Delirium due to known physiological condition: Secondary | ICD-10-CM | POA: Diagnosis not present

## 2019-06-17 DIAGNOSIS — R4182 Altered mental status, unspecified: Secondary | ICD-10-CM | POA: Diagnosis not present

## 2019-06-17 DIAGNOSIS — F0390 Unspecified dementia without behavioral disturbance: Secondary | ICD-10-CM

## 2019-06-17 DIAGNOSIS — F0391 Unspecified dementia with behavioral disturbance: Secondary | ICD-10-CM | POA: Diagnosis not present

## 2019-06-17 DIAGNOSIS — I4892 Unspecified atrial flutter: Secondary | ICD-10-CM

## 2019-06-18 DIAGNOSIS — F05 Delirium due to known physiological condition: Secondary | ICD-10-CM | POA: Diagnosis not present

## 2019-06-18 DIAGNOSIS — I4892 Unspecified atrial flutter: Secondary | ICD-10-CM | POA: Diagnosis not present

## 2019-06-18 DIAGNOSIS — F0390 Unspecified dementia without behavioral disturbance: Secondary | ICD-10-CM | POA: Diagnosis not present

## 2019-06-18 DIAGNOSIS — F0391 Unspecified dementia with behavioral disturbance: Secondary | ICD-10-CM | POA: Diagnosis not present

## 2019-06-18 DIAGNOSIS — R4182 Altered mental status, unspecified: Secondary | ICD-10-CM | POA: Diagnosis not present

## 2019-06-19 DIAGNOSIS — F05 Delirium due to known physiological condition: Secondary | ICD-10-CM | POA: Diagnosis not present

## 2019-06-19 DIAGNOSIS — R4182 Altered mental status, unspecified: Secondary | ICD-10-CM | POA: Diagnosis not present

## 2019-06-19 DIAGNOSIS — F0390 Unspecified dementia without behavioral disturbance: Secondary | ICD-10-CM | POA: Diagnosis not present

## 2019-06-19 DIAGNOSIS — I4892 Unspecified atrial flutter: Secondary | ICD-10-CM | POA: Diagnosis not present

## 2019-06-19 DIAGNOSIS — F0391 Unspecified dementia with behavioral disturbance: Secondary | ICD-10-CM | POA: Diagnosis not present

## 2019-06-20 DIAGNOSIS — F0391 Unspecified dementia with behavioral disturbance: Secondary | ICD-10-CM | POA: Diagnosis not present

## 2019-06-20 DIAGNOSIS — R4182 Altered mental status, unspecified: Secondary | ICD-10-CM | POA: Diagnosis not present

## 2019-06-20 DIAGNOSIS — F0390 Unspecified dementia without behavioral disturbance: Secondary | ICD-10-CM | POA: Diagnosis not present

## 2019-06-20 DIAGNOSIS — I4892 Unspecified atrial flutter: Secondary | ICD-10-CM | POA: Diagnosis not present

## 2019-06-20 DIAGNOSIS — F05 Delirium due to known physiological condition: Secondary | ICD-10-CM | POA: Diagnosis not present

## 2019-06-21 DIAGNOSIS — F0391 Unspecified dementia with behavioral disturbance: Secondary | ICD-10-CM | POA: Diagnosis not present

## 2019-06-21 DIAGNOSIS — R4182 Altered mental status, unspecified: Secondary | ICD-10-CM | POA: Diagnosis not present

## 2019-06-21 DIAGNOSIS — I4892 Unspecified atrial flutter: Secondary | ICD-10-CM | POA: Diagnosis not present

## 2019-06-21 DIAGNOSIS — F05 Delirium due to known physiological condition: Secondary | ICD-10-CM | POA: Diagnosis not present

## 2019-06-21 DIAGNOSIS — F0390 Unspecified dementia without behavioral disturbance: Secondary | ICD-10-CM | POA: Diagnosis not present

## 2019-06-22 DIAGNOSIS — R4182 Altered mental status, unspecified: Secondary | ICD-10-CM | POA: Diagnosis not present

## 2019-06-22 DIAGNOSIS — F05 Delirium due to known physiological condition: Secondary | ICD-10-CM | POA: Diagnosis not present

## 2019-06-22 DIAGNOSIS — F0391 Unspecified dementia with behavioral disturbance: Secondary | ICD-10-CM | POA: Diagnosis not present

## 2019-06-22 DIAGNOSIS — F0390 Unspecified dementia without behavioral disturbance: Secondary | ICD-10-CM | POA: Diagnosis not present

## 2019-06-22 DIAGNOSIS — I4892 Unspecified atrial flutter: Secondary | ICD-10-CM | POA: Diagnosis not present

## 2019-06-23 DIAGNOSIS — E871 Hypo-osmolality and hyponatremia: Secondary | ICD-10-CM | POA: Diagnosis not present

## 2019-06-23 DIAGNOSIS — R410 Disorientation, unspecified: Secondary | ICD-10-CM | POA: Diagnosis not present

## 2019-06-23 DIAGNOSIS — R441 Visual hallucinations: Secondary | ICD-10-CM | POA: Diagnosis not present

## 2019-06-23 DIAGNOSIS — R600 Localized edema: Secondary | ICD-10-CM | POA: Diagnosis not present

## 2019-06-23 DIAGNOSIS — F0391 Unspecified dementia with behavioral disturbance: Secondary | ICD-10-CM | POA: Diagnosis not present

## 2019-06-23 DIAGNOSIS — E860 Dehydration: Secondary | ICD-10-CM | POA: Diagnosis not present

## 2019-06-23 DIAGNOSIS — J984 Other disorders of lung: Secondary | ICD-10-CM | POA: Diagnosis not present

## 2019-06-23 DIAGNOSIS — E785 Hyperlipidemia, unspecified: Secondary | ICD-10-CM | POA: Diagnosis not present

## 2019-06-23 DIAGNOSIS — F05 Delirium due to known physiological condition: Secondary | ICD-10-CM | POA: Diagnosis not present

## 2019-06-23 DIAGNOSIS — I10 Essential (primary) hypertension: Secondary | ICD-10-CM | POA: Diagnosis not present

## 2019-06-23 DIAGNOSIS — R4182 Altered mental status, unspecified: Secondary | ICD-10-CM | POA: Diagnosis not present

## 2019-06-23 DIAGNOSIS — R5381 Other malaise: Secondary | ICD-10-CM | POA: Diagnosis not present

## 2019-06-23 DIAGNOSIS — I5032 Chronic diastolic (congestive) heart failure: Secondary | ICD-10-CM | POA: Diagnosis not present

## 2019-06-23 DIAGNOSIS — E119 Type 2 diabetes mellitus without complications: Secondary | ICD-10-CM | POA: Diagnosis not present

## 2019-06-23 DIAGNOSIS — R0602 Shortness of breath: Secondary | ICD-10-CM | POA: Diagnosis not present

## 2019-06-23 DIAGNOSIS — I110 Hypertensive heart disease with heart failure: Secondary | ICD-10-CM | POA: Diagnosis not present

## 2019-06-23 DIAGNOSIS — Z885 Allergy status to narcotic agent status: Secondary | ICD-10-CM | POA: Diagnosis not present

## 2019-06-23 DIAGNOSIS — I5033 Acute on chronic diastolic (congestive) heart failure: Secondary | ICD-10-CM | POA: Diagnosis not present

## 2019-06-23 DIAGNOSIS — U071 COVID-19: Secondary | ICD-10-CM | POA: Diagnosis not present

## 2019-06-23 DIAGNOSIS — M2550 Pain in unspecified joint: Secondary | ICD-10-CM | POA: Diagnosis not present

## 2019-06-23 DIAGNOSIS — Z23 Encounter for immunization: Secondary | ICD-10-CM | POA: Diagnosis not present

## 2019-06-23 DIAGNOSIS — J449 Chronic obstructive pulmonary disease, unspecified: Secondary | ICD-10-CM | POA: Diagnosis not present

## 2019-06-23 DIAGNOSIS — R0902 Hypoxemia: Secondary | ICD-10-CM | POA: Diagnosis not present

## 2019-06-23 DIAGNOSIS — N179 Acute kidney failure, unspecified: Secondary | ICD-10-CM | POA: Diagnosis not present

## 2019-06-23 DIAGNOSIS — Z7401 Bed confinement status: Secondary | ICD-10-CM | POA: Diagnosis not present

## 2019-06-23 DIAGNOSIS — I4892 Unspecified atrial flutter: Secondary | ICD-10-CM | POA: Diagnosis not present

## 2019-06-23 DIAGNOSIS — R440 Auditory hallucinations: Secondary | ICD-10-CM | POA: Diagnosis not present

## 2019-06-23 DIAGNOSIS — F0390 Unspecified dementia without behavioral disturbance: Secondary | ICD-10-CM | POA: Diagnosis not present

## 2019-06-23 DIAGNOSIS — I4891 Unspecified atrial fibrillation: Secondary | ICD-10-CM | POA: Diagnosis not present

## 2019-06-23 DIAGNOSIS — J9601 Acute respiratory failure with hypoxia: Secondary | ICD-10-CM | POA: Diagnosis not present

## 2019-06-23 DIAGNOSIS — Z79899 Other long term (current) drug therapy: Secondary | ICD-10-CM | POA: Diagnosis not present

## 2019-06-23 DIAGNOSIS — R001 Bradycardia, unspecified: Secondary | ICD-10-CM | POA: Diagnosis not present

## 2019-06-23 DIAGNOSIS — J90 Pleural effusion, not elsewhere classified: Secondary | ICD-10-CM | POA: Diagnosis not present

## 2019-07-01 DIAGNOSIS — R001 Bradycardia, unspecified: Secondary | ICD-10-CM | POA: Diagnosis not present

## 2019-07-01 DIAGNOSIS — I5032 Chronic diastolic (congestive) heart failure: Secondary | ICD-10-CM | POA: Diagnosis not present

## 2019-07-01 DIAGNOSIS — I4891 Unspecified atrial fibrillation: Secondary | ICD-10-CM | POA: Diagnosis not present

## 2019-07-01 DIAGNOSIS — R600 Localized edema: Secondary | ICD-10-CM | POA: Diagnosis not present

## 2019-07-08 ENCOUNTER — Ambulatory Visit: Payer: Medicare Other | Admitting: Cardiology

## 2019-07-08 DIAGNOSIS — U071 COVID-19: Secondary | ICD-10-CM | POA: Diagnosis not present

## 2019-07-08 DIAGNOSIS — R600 Localized edema: Secondary | ICD-10-CM | POA: Diagnosis not present

## 2019-07-08 DIAGNOSIS — I5032 Chronic diastolic (congestive) heart failure: Secondary | ICD-10-CM | POA: Diagnosis not present

## 2019-07-08 DIAGNOSIS — I4891 Unspecified atrial fibrillation: Secondary | ICD-10-CM | POA: Diagnosis not present

## 2019-07-10 ENCOUNTER — Inpatient Hospital Stay
Admission: EM | Admit: 2019-07-10 | Payer: Medicare Other | Source: Other Acute Inpatient Hospital | Admitting: Family Medicine

## 2019-07-10 DIAGNOSIS — U071 COVID-19: Secondary | ICD-10-CM | POA: Diagnosis not present

## 2019-07-10 DIAGNOSIS — F0390 Unspecified dementia without behavioral disturbance: Secondary | ICD-10-CM | POA: Diagnosis not present

## 2019-07-10 DIAGNOSIS — J9601 Acute respiratory failure with hypoxia: Secondary | ICD-10-CM | POA: Diagnosis not present

## 2019-07-10 DIAGNOSIS — I4891 Unspecified atrial fibrillation: Secondary | ICD-10-CM | POA: Diagnosis not present

## 2019-07-10 DIAGNOSIS — J449 Chronic obstructive pulmonary disease, unspecified: Secondary | ICD-10-CM | POA: Diagnosis not present

## 2019-07-10 DIAGNOSIS — R0602 Shortness of breath: Secondary | ICD-10-CM | POA: Diagnosis not present

## 2019-07-10 DIAGNOSIS — R0902 Hypoxemia: Secondary | ICD-10-CM | POA: Diagnosis not present

## 2019-07-10 DIAGNOSIS — J90 Pleural effusion, not elsewhere classified: Secondary | ICD-10-CM | POA: Diagnosis not present

## 2019-07-11 ENCOUNTER — Other Ambulatory Visit: Payer: Self-pay

## 2019-07-11 ENCOUNTER — Inpatient Hospital Stay (HOSPITAL_COMMUNITY): Payer: Medicare Other

## 2019-07-11 ENCOUNTER — Inpatient Hospital Stay (HOSPITAL_COMMUNITY)
Admission: AD | Admit: 2019-07-11 | Discharge: 2019-07-16 | DRG: 177 | Disposition: A | Discharge status: Skilled Nursing Facility | Payer: Medicare Other | Source: Other Acute Inpatient Hospital | Attending: Internal Medicine | Admitting: Internal Medicine

## 2019-07-11 DIAGNOSIS — E1122 Type 2 diabetes mellitus with diabetic chronic kidney disease: Secondary | ICD-10-CM | POA: Diagnosis present

## 2019-07-11 DIAGNOSIS — J1289 Other viral pneumonia: Secondary | ICD-10-CM | POA: Diagnosis present

## 2019-07-11 DIAGNOSIS — R440 Auditory hallucinations: Secondary | ICD-10-CM | POA: Diagnosis not present

## 2019-07-11 DIAGNOSIS — G4700 Insomnia, unspecified: Secondary | ICD-10-CM | POA: Diagnosis not present

## 2019-07-11 DIAGNOSIS — R531 Weakness: Secondary | ICD-10-CM | POA: Diagnosis not present

## 2019-07-11 DIAGNOSIS — I351 Nonrheumatic aortic (valve) insufficiency: Secondary | ICD-10-CM | POA: Diagnosis present

## 2019-07-11 DIAGNOSIS — I10 Essential (primary) hypertension: Secondary | ICD-10-CM | POA: Diagnosis present

## 2019-07-11 DIAGNOSIS — M2550 Pain in unspecified joint: Secondary | ICD-10-CM | POA: Diagnosis not present

## 2019-07-11 DIAGNOSIS — Z7901 Long term (current) use of anticoagulants: Secondary | ICD-10-CM

## 2019-07-11 DIAGNOSIS — Z79899 Other long term (current) drug therapy: Secondary | ICD-10-CM

## 2019-07-11 DIAGNOSIS — R441 Visual hallucinations: Secondary | ICD-10-CM | POA: Diagnosis not present

## 2019-07-11 DIAGNOSIS — I4891 Unspecified atrial fibrillation: Secondary | ICD-10-CM | POA: Diagnosis not present

## 2019-07-11 DIAGNOSIS — Z66 Do not resuscitate: Secondary | ICD-10-CM | POA: Diagnosis present

## 2019-07-11 DIAGNOSIS — F0390 Unspecified dementia without behavioral disturbance: Secondary | ICD-10-CM | POA: Diagnosis present

## 2019-07-11 DIAGNOSIS — E871 Hypo-osmolality and hyponatremia: Secondary | ICD-10-CM | POA: Diagnosis not present

## 2019-07-11 DIAGNOSIS — I130 Hypertensive heart and chronic kidney disease with heart failure and stage 1 through stage 4 chronic kidney disease, or unspecified chronic kidney disease: Secondary | ICD-10-CM | POA: Diagnosis present

## 2019-07-11 DIAGNOSIS — U071 COVID-19: Principal | ICD-10-CM | POA: Diagnosis present

## 2019-07-11 DIAGNOSIS — R410 Disorientation, unspecified: Secondary | ICD-10-CM | POA: Diagnosis not present

## 2019-07-11 DIAGNOSIS — R600 Localized edema: Secondary | ICD-10-CM | POA: Diagnosis not present

## 2019-07-11 DIAGNOSIS — Z8619 Personal history of other infectious and parasitic diseases: Secondary | ICD-10-CM | POA: Diagnosis not present

## 2019-07-11 DIAGNOSIS — R0602 Shortness of breath: Secondary | ICD-10-CM | POA: Diagnosis not present

## 2019-07-11 DIAGNOSIS — I5032 Chronic diastolic (congestive) heart failure: Secondary | ICD-10-CM | POA: Diagnosis present

## 2019-07-11 DIAGNOSIS — J1282 Pneumonia due to coronavirus disease 2019: Secondary | ICD-10-CM | POA: Diagnosis present

## 2019-07-11 DIAGNOSIS — R413 Other amnesia: Secondary | ICD-10-CM | POA: Diagnosis present

## 2019-07-11 DIAGNOSIS — I5033 Acute on chronic diastolic (congestive) heart failure: Secondary | ICD-10-CM | POA: Diagnosis not present

## 2019-07-11 DIAGNOSIS — F209 Schizophrenia, unspecified: Secondary | ICD-10-CM | POA: Diagnosis not present

## 2019-07-11 DIAGNOSIS — J9601 Acute respiratory failure with hypoxia: Secondary | ICD-10-CM | POA: Diagnosis present

## 2019-07-11 DIAGNOSIS — R4182 Altered mental status, unspecified: Secondary | ICD-10-CM | POA: Diagnosis not present

## 2019-07-11 DIAGNOSIS — E785 Hyperlipidemia, unspecified: Secondary | ICD-10-CM | POA: Diagnosis present

## 2019-07-11 DIAGNOSIS — Z87891 Personal history of nicotine dependence: Secondary | ICD-10-CM

## 2019-07-11 DIAGNOSIS — I4821 Permanent atrial fibrillation: Secondary | ICD-10-CM | POA: Diagnosis present

## 2019-07-11 DIAGNOSIS — N179 Acute kidney failure, unspecified: Secondary | ICD-10-CM | POA: Diagnosis not present

## 2019-07-11 DIAGNOSIS — N183 Chronic kidney disease, stage 3 unspecified: Secondary | ICD-10-CM | POA: Diagnosis present

## 2019-07-11 DIAGNOSIS — I4892 Unspecified atrial flutter: Secondary | ICD-10-CM | POA: Diagnosis not present

## 2019-07-11 DIAGNOSIS — R001 Bradycardia, unspecified: Secondary | ICD-10-CM | POA: Diagnosis not present

## 2019-07-11 DIAGNOSIS — Z7401 Bed confinement status: Secondary | ICD-10-CM | POA: Diagnosis not present

## 2019-07-11 DIAGNOSIS — G8929 Other chronic pain: Secondary | ICD-10-CM | POA: Diagnosis not present

## 2019-07-11 LAB — CBC WITH DIFFERENTIAL/PLATELET
Abs Immature Granulocytes: 0.02 10*3/uL (ref 0.00–0.07)
Basophils Absolute: 0 10*3/uL (ref 0.0–0.1)
Basophils Relative: 0 %
Eosinophils Absolute: 0 10*3/uL (ref 0.0–0.5)
Eosinophils Relative: 0 %
HCT: 32.1 % — ABNORMAL LOW (ref 36.0–46.0)
Hemoglobin: 10 g/dL — ABNORMAL LOW (ref 12.0–15.0)
Immature Granulocytes: 0 %
Lymphocytes Relative: 5 %
Lymphs Abs: 0.3 10*3/uL — ABNORMAL LOW (ref 0.7–4.0)
MCH: 28.7 pg (ref 26.0–34.0)
MCHC: 31.2 g/dL (ref 30.0–36.0)
MCV: 92 fL (ref 80.0–100.0)
Monocytes Absolute: 0.2 10*3/uL (ref 0.1–1.0)
Monocytes Relative: 4 %
Neutro Abs: 5.1 10*3/uL (ref 1.7–7.7)
Neutrophils Relative %: 91 %
Platelets: 306 10*3/uL (ref 150–400)
RBC: 3.49 MIL/uL — ABNORMAL LOW (ref 3.87–5.11)
RDW: 14.4 % (ref 11.5–15.5)
WBC: 5.6 10*3/uL (ref 4.0–10.5)
nRBC: 0 % (ref 0.0–0.2)

## 2019-07-11 LAB — COMPREHENSIVE METABOLIC PANEL
ALT: 17 U/L (ref 0–44)
AST: 13 U/L — ABNORMAL LOW (ref 15–41)
Albumin: 3.1 g/dL — ABNORMAL LOW (ref 3.5–5.0)
Alkaline Phosphatase: 133 U/L — ABNORMAL HIGH (ref 38–126)
Anion gap: 13 (ref 5–15)
BUN: 21 mg/dL (ref 8–23)
CO2: 25 mmol/L (ref 22–32)
Calcium: 8.3 mg/dL — ABNORMAL LOW (ref 8.9–10.3)
Chloride: 98 mmol/L (ref 98–111)
Creatinine, Ser: 1.4 mg/dL — ABNORMAL HIGH (ref 0.44–1.00)
GFR calc Af Amer: 38 mL/min — ABNORMAL LOW (ref >60)
GFR calc non Af Amer: 33 mL/min — ABNORMAL LOW (ref >60)
Glucose, Bld: 125 mg/dL — ABNORMAL HIGH (ref 70–99)
Potassium: 3.6 mmol/L (ref 3.5–5.1)
Sodium: 136 mmol/L (ref 135–145)
Total Bilirubin: 1.1 mg/dL (ref 0.3–1.2)
Total Protein: 6.2 g/dL — ABNORMAL LOW (ref 6.5–8.1)

## 2019-07-11 LAB — LACTATE DEHYDROGENASE: LDH: 193 U/L — ABNORMAL HIGH (ref 98–192)

## 2019-07-11 LAB — D-DIMER, QUANTITATIVE: D-Dimer, Quant: 1.01 ug/mL-FEU — ABNORMAL HIGH (ref 0.00–0.50)

## 2019-07-11 LAB — C-REACTIVE PROTEIN: CRP: 1.2 mg/dL — ABNORMAL HIGH (ref <1.0)

## 2019-07-11 LAB — TYPE AND SCREEN
ABO/RH(D): O POS
Antibody Screen: NEGATIVE

## 2019-07-11 LAB — ABO/RH: ABO/RH(D): O POS

## 2019-07-11 LAB — PROCALCITONIN: Procalcitonin: <0.1 ng/mL

## 2019-07-11 LAB — BRAIN NATRIURETIC PEPTIDE: B Natriuretic Peptide: 472.8 pg/mL — ABNORMAL HIGH (ref 0.0–100.0)

## 2019-07-11 MED ORDER — SODIUM CHLORIDE 0.9 % IV SOLN: 250.0000 mL | INTRAVENOUS | Status: DC | PRN

## 2019-07-11 MED ORDER — CHLORHEXIDINE GLUCONATE CLOTH 2 % EX PADS
6.0000 | MEDICATED_PAD | Freq: Every day | CUTANEOUS | Status: DC
  Administered 2019-07-11 – 2019-07-16 (×6): 6 via TOPICAL

## 2019-07-11 MED ORDER — ONDANSETRON HCL 4 MG/2ML IJ SOLN
4.0000 mg | Freq: Four times a day (QID) | INTRAMUSCULAR | Status: DC | PRN
  Filled 2019-07-11: qty 2

## 2019-07-11 MED ORDER — MEMANTINE HCL 10 MG PO TABS
10.0000 mg | ORAL_TABLET | Freq: Two times a day (BID) | ORAL | Status: DC
  Administered 2019-07-11 – 2019-07-16 (×10): 10 mg via ORAL
  Filled 2019-07-11 (×12): qty 1

## 2019-07-11 MED ORDER — ENOXAPARIN SODIUM 40 MG/0.4ML ~~LOC~~ SOLN: 40.0000 mg | SUBCUTANEOUS | Status: DC

## 2019-07-11 MED ORDER — GUAIFENESIN-DM 100-10 MG/5ML PO SYRP: 10.0000 mL | ORAL_SOLUTION | ORAL | Status: DC | PRN

## 2019-07-11 MED ORDER — METOPROLOL TARTRATE 25 MG PO TABS
12.5000 mg | ORAL_TABLET | Freq: Two times a day (BID) | ORAL | Status: DC
  Administered 2019-07-11 – 2019-07-16 (×10): 12.5 mg via ORAL
  Filled 2019-07-11 (×10): qty 1

## 2019-07-11 MED ORDER — SODIUM CHLORIDE 0.9 % IV SOLN
100.0000 mg | INTRAVENOUS | Status: DC
  Filled 2019-07-11: qty 20

## 2019-07-11 MED ORDER — OXYCODONE HCL 5 MG PO TABS
5.0000 mg | ORAL_TABLET | ORAL | Status: DC | PRN
  Administered 2019-07-11 – 2019-07-13 (×3): 5 mg via ORAL
  Filled 2019-07-11 (×3): qty 1

## 2019-07-11 MED ORDER — DONEPEZIL HCL 10 MG PO TABS
5.0000 mg | ORAL_TABLET | Freq: Every day | ORAL | Status: DC
  Administered 2019-07-11 – 2019-07-15 (×5): 5 mg via ORAL
  Filled 2019-07-11 (×5): qty 1

## 2019-07-11 MED ORDER — SODIUM CHLORIDE 0.9% FLUSH: 3.0000 mL | INTRAVENOUS | Status: DC | PRN

## 2019-07-11 MED ORDER — FUROSEMIDE 20 MG PO TABS
40.0000 mg | ORAL_TABLET | Freq: Every day | ORAL | Status: DC
  Administered 2019-07-12: 40 mg via ORAL
  Filled 2019-07-11 (×2): qty 2

## 2019-07-11 MED ORDER — APIXABAN 2.5 MG PO TABS
2.5000 mg | ORAL_TABLET | Freq: Two times a day (BID) | ORAL | Status: DC
  Administered 2019-07-12 – 2019-07-16 (×9): 2.5 mg via ORAL
  Filled 2019-07-11 (×12): qty 1

## 2019-07-11 MED ORDER — POTASSIUM CHLORIDE CRYS ER 20 MEQ PO TBCR
40.0000 meq | EXTENDED_RELEASE_TABLET | Freq: Every day | ORAL | Status: DC
  Administered 2019-07-11 – 2019-07-14 (×4): 40 meq via ORAL
  Filled 2019-07-11 (×4): qty 2

## 2019-07-11 MED ORDER — DIGOXIN 125 MCG PO TABS
0.1250 mg | ORAL_TABLET | Freq: Every day | ORAL | Status: DC
  Administered 2019-07-12 – 2019-07-16 (×5): 0.125 mg via ORAL
  Filled 2019-07-11 (×8): qty 1

## 2019-07-11 MED ORDER — RISPERIDONE 1 MG PO TABS
1.0000 mg | ORAL_TABLET | Freq: Every day | ORAL | Status: DC
  Administered 2019-07-11 – 2019-07-15 (×5): 1 mg via ORAL
  Filled 2019-07-11 (×6): qty 1

## 2019-07-11 MED ORDER — ZINC SULFATE 220 (50 ZN) MG PO CAPS
220.0000 mg | ORAL_CAPSULE | Freq: Every day | ORAL | Status: DC
  Administered 2019-07-11 – 2019-07-16 (×6): 220 mg via ORAL
  Filled 2019-07-11 (×7): qty 1

## 2019-07-11 MED ORDER — POLYETHYLENE GLYCOL 3350 17 G PO PACK: 17.0000 g | PACK | Freq: Every day | ORAL | Status: DC | PRN

## 2019-07-11 MED ORDER — SODIUM CHLORIDE 0.9 % IV SOLN
200.0000 mg | Freq: Once | INTRAVENOUS | Status: AC
  Administered 2019-07-11: 200 mg via INTRAVENOUS
  Filled 2019-07-11: qty 40

## 2019-07-11 MED ORDER — HYDROCOD POLST-CPM POLST ER 10-8 MG/5ML PO SUER: 5.0000 mL | Freq: Two times a day (BID) | ORAL | Status: DC | PRN

## 2019-07-11 MED ORDER — SODIUM CHLORIDE 0.9% FLUSH
3.0000 mL | Freq: Two times a day (BID) | INTRAVENOUS | Status: DC
  Administered 2019-07-11 – 2019-07-16 (×10): 3 mL via INTRAVENOUS

## 2019-07-11 MED ORDER — ATORVASTATIN CALCIUM 40 MG PO TABS
40.0000 mg | ORAL_TABLET | Freq: Every day | ORAL | Status: DC
  Administered 2019-07-11 – 2019-07-15 (×5): 40 mg via ORAL
  Filled 2019-07-11 (×5): qty 1

## 2019-07-11 MED ORDER — METHYLPREDNISOLONE SODIUM SUCC 40 MG IJ SOLR
40.0000 mg | Freq: Two times a day (BID) | INTRAMUSCULAR | Status: DC
  Administered 2019-07-11 (×2): 40 mg via INTRAVENOUS
  Filled 2019-07-11 (×2): qty 1

## 2019-07-11 MED ORDER — ACETAMINOPHEN 325 MG PO TABS
650.0000 mg | ORAL_TABLET | Freq: Four times a day (QID) | ORAL | Status: DC | PRN
  Administered 2019-07-13 – 2019-07-14 (×2): 650 mg via ORAL
  Filled 2019-07-11 (×2): qty 2

## 2019-07-11 MED ORDER — ONDANSETRON HCL 4 MG PO TABS
4.0000 mg | ORAL_TABLET | Freq: Four times a day (QID) | ORAL | Status: DC | PRN
  Administered 2019-07-13: 4 mg via ORAL
  Filled 2019-07-11: qty 1

## 2019-07-11 MED ORDER — AMIODARONE HCL 100 MG PO TABS
200.0000 mg | ORAL_TABLET | Freq: Two times a day (BID) | ORAL | Status: DC
  Administered 2019-07-12 – 2019-07-16 (×9): 200 mg via ORAL
  Filled 2019-07-11 (×10): qty 2

## 2019-07-11 MED ORDER — VITAMIN C 500 MG PO TABS
500.0000 mg | ORAL_TABLET | Freq: Every day | ORAL | Status: DC
  Administered 2019-07-11 – 2019-07-16 (×6): 500 mg via ORAL
  Filled 2019-07-11 (×6): qty 1

## 2019-07-11 NOTE — H&P (Signed)
HISTORY AND PHYSICAL       PATIENT DETAILS Name: [name] Age: 85 y.o. Sex: female Date of Birth: [birth year] Admit Date: 07/11/2019 [id number], [name] Havens, MD   Patient coming from: SNF to [facility] to [place]:  Sent from SNF for evaluation of hypoxemia to [facility] emergency room.  HPI: Note-patient with advanced dementia-not able to participate in the history taking process-history is obtained after reviewing ED note-and discussing with patient's daughter over the phone.   [name] is a 85 y.o. female with medical history significant of chronic diastolic heart failure, permanent atrial fibrillation, CKD stage III-who has been at a local SNF in [place] for the past 2 weeks.  She apparently spent approximately 10 days in [facility] last month for issues with CHF and A. fib, she was subsequently discharged to SNF where she has been doing rehab since then.     Apparently on 10/1-she was randomly tested at [facility] for COVID-19-she was found to be positive but apparently did not have any symptoms.  [name] started developing low-grade fever, cough, and shortness of breath-she was placed on oxygen and transferred to the emergency room at [facility].  At [facility] health she was managed with supportive care-a repeat COVID PCR at [facility] was negative.  ED MD felt that this was a false negative test-as patient's clinical features was consistent with COVID-19-and her test on 10/1 was a true positive.  After discussion with the night [facility] MD-patient was then transferred to [facility].   ED Course:  Given Solu-Medrol-provided supportive care-and transferred to [facility].  Note: Lives at:SNF Mobility: Cane/Walker Chronic Indwelling Foley:yes   REVIEW OF SYSTEMS:  Unable to obtain-as patient with advanced dementia.   ALLERGIES:   Allergies  Allergen Reactions   Aspirin     PT UNSURE OF REACTION     PAST MEDICAL HISTORY: Past Medical History:  Diagnosis Date   Aortic insufficiency    Chronic atrial fibrillation    echo 9/09 EF 60%, mild MR   Cognitive deficits 08/07/2013   Dementia ([place])    Diabetes mellitus    Ectopic atrial tachycardia ([facility])    HTN (hypertension)    Hyperlipidemia    Mild mitral regurgitation    Moderate tricuspid regurgitation     PAST SURGICAL HISTORY: Past Surgical History:  Procedure Laterality Date   APPENDECTOMY     SHOULDER SURGERY     Bilateral    MEDICATIONS AT HOME: Prior to Admission medications   Medication Sig Start Date End Date Taking? Authorizing Provider  amiodarone (PACERONE) 200 MG tablet Take 200 mg by mouth 2 (two) times daily.   Yes [provider]  apixaban (ELIQUIS) 2.5 MG TABS tablet Take 2.5 mg by mouth 2 (two) times daily.   Yes [provider]  digoxin (LANOXIN) 0.125 MG tablet Take 0.125 mg by mouth daily.   Yes [provider]  risperiDONE (RISPERDAL) 1 MG tablet Take 1 mg by mouth at bedtime.   Yes [provider]  atorvastatin (LIPITOR) 40 MG tablet Take 40 mg by mouth at bedtime. 07/30/15   Nicoletta Dress, MD  diltiazem (CARDIZEM CD) 180 MG 24 hr capsule Take 180 mg by mouth daily. 04/06/19   [provider]  donepezil (ARICEPT) 5 MG tablet TAKE 1 TABLET BY MOUTH EVERY NIGHT 05/22/19   Ward Givens, NP  fish oil-omega-3 fatty acids 1000 MG capsule Take 1  g by mouth daily.      [provider]  furosemide (LASIX) 40 MG tablet TAKE 1 TABLET BY MOUTH EVERY DAY 04/08/19   Burnell Blanks, MD  KLOR-CON M20 20 MEQ tablet TAKE 2 TABLETS BY MOUTH DAILY. 05/10/19   Burnell Blanks, MD  memantine (NAMENDA) 10 MG tablet Take 1 tablet (10 mg total) by mouth 2 (two) times daily. 03/20/18   Ward Givens, NP  metoprolol tartrate (LOPRESSOR) 25 MG tablet Take 25 mg by mouth 2 (two) times daily.    [provider]    FAMILY HISTORY: Family  History  Problem Relation Age of Onset   Cancer Mother    Cancer Father    Cancer Other        family history of it.     SOCIAL HISTORY:  reports that she quit smoking about 36 years ago. Her smoking use included cigarettes. She has a 8.00 pack-year smoking history. She has never used smokeless tobacco. She reports that she does not drink alcohol or use drugs.  PHYSICAL EXAM: Blood pressure (!) 182/78, pulse 99, temperature 97.9 F (36.6 C), temperature source Axillary, SpO2 99 %.  General appearance :Awake,-pleasantly confused on 2 L of oxygen. Eyes:, pupils equally reactive to light and accomodation,no scleral icterus.Pink conjunctiva HEENT: Atraumatic and Normocephalic Neck: supple, no JVD.  Resp:Good air entry bilaterally, no added sounds  CVS: S1 S2 irregular, plus systolic murmur GI: Bowel sounds present, Non tender and not distended with no gaurding, rigidity or rebound. Extremities: B/L Lower Ext shows no edema, both legs are warm to touch Neurology: Seems to move all 4 extremities-speech is nonsensical but clear. Musculoskeletal:gait appears to be normal.No digital cyanosis Skin:No Rash, warm and dry Wounds:[name]  LABS ON ADMISSION:  I have personally reviewed following labs and imaging studies  CBC: Recent Labs  Lab 07/11/19 1230  WBC 5.6  NEUTROABS 5.1  HGB 10.0*  HCT 32.1*  MCV 92.0  PLT [id number]    Basic Metabolic Panel: Recent Labs  Lab 07/11/19 1230  NA 136  K 3.6  CL 98  CO2 25  GLUCOSE 125*  BUN 21  CREATININE 1.40*  CALCIUM 8.3*    GFR: CrCl cannot be calculated (Unknown ideal weight.).  Liver Function Tests: Recent Labs  Lab 07/11/19 1230  AST 13*  ALT 17  ALKPHOS 133*  BILITOT 1.1  PROT 6.2*  ALBUMIN 3.1*   No results for input(s): LIPASE, AMYLASE in the last 168 hours. No results for input(s): AMMONIA in the last 168 hours.  Coagulation Profile: No results for input(s): INR, PROTIME in the last 168 hours.  Cardiac  Enzymes: No results for input(s): CKTOTAL, CKMB, CKMBINDEX, TROPONINI in the last 168 hours.  BNP (last 3 results) No results for input(s): PROBNP in the last 8760 hours.  HbA1C: No results for input(s): HGBA1C in the last 72 hours.  CBG: No results for input(s): GLUCAP in the last 168 hours.  Lipid Profile: No results for input(s): CHOL, HDL, LDLCALC, TRIG, CHOLHDL, LDLDIRECT in the last 72 hours.  Thyroid Function Tests: No results for input(s): TSH, T4TOTAL, FREET4, T3FREE, THYROIDAB in the last 72 hours.  Anemia Panel: No results for input(s): VITAMINB12, FOLATE, FERRITIN, TIBC, IRON, RETICCTPCT in the last 72 hours.  Urine analysis: No results found for: COLORURINE, APPEARANCEUR, LABSPEC, PHURINE, GLUCOSEU, HGBUR, BILIRUBINUR, KETONESUR, PROTEINUR, UROBILINOGEN, NITRITE, LEUKOCYTESUR  Sepsis Labs: Lactic Acid, Venous No results found for: [place]   Microbiology: No results found for this or any  previous visit (from the past 240 hour(s)).    RADIOLOGIC STUDIES ON ADMISSION: No results found.  I have personally reviewed images of chest xray-bilateral interstitial infiltrates-CHF versus atypical infection  EKG:  Pending  ASSESSMENT AND PLAN: Acute hypoxemic respiratory failure likely secondary to COVID-19: See discussion in HPI above-it was felt at [facility] that given patient's symptomatology of low-grade fever, hypoxemia-and recent COVID-19 positive test on 10/1-that patient's overall clinical picture was positive for COVID-19.  It is unknown why a repeat COVID-19 test was done at [facility] on 10/7-it was negative.  In any event-patient will be treated for presumed COVID-19 pneumonia with steroids and Remdesivir.  She is very frail-deconditioned-and clearly is not a candidate for any further escalation in care.  I spoke with patient's daughter over the phone-she Drue Novel is a DNR.  We will not plan on any further escalation of care if she  deteriorates.  Permanent atrial fibrillation: Resume amiodarone, digoxin-continue Eliquis.  Will await reconciliation of medications by pharmacy before resuming other rate control medications.  Chronic diastolic heart failure: No signs of volume overload-no JVD-we will give 1 dose of IV Lasix to maintain negative balance and resume her regular dosing of oral Lasix from tomorrow.  Dementia: Resume Namenda and Aricept-expect delirium during this hospital stay.  Aortic insufficiency: Stable for follow-up in the outpatient setting.  Palliative care: Poor candidate for aggressive care-Long discussion with patient's daughter over the phone-DNR-goal of care is for gentle medical treatment.  Further plan will depend as patient's clinical course evolves and further radiologic and laboratory data become available. Patient will be monitored closely.  Above noted plan was discussed with daughter over the phone.    CONSULTS: None  DVT Prophylaxis: eliquis  Code Status: Full Code  Disposition Plan:  Discharge back to SNF in the next few days.   Admission status:  Inpatient v going to tele  The medical decision making on this patient was of high complexity and the patient is at high risk for clinical deterioration, therefore this is a level 3 visit.   Total time spent  45 minutes.Greater than 50% of this time was spent in counseling, explanation of diagnosis, planning of further management, and coordination of care.  Severity of illness: The appropriate patient status for this patient is INPATIENT. Inpatient status is judged to be reasonable and necessary in order to provide the required intensity of service to ensure the patient's safety. The patient's presenting symptoms, physical exam findings, and initial radiographic and laboratory data in the context of their chronic comorbidities is felt to place them at high risk for further clinical deterioration. Furthermore, it is not anticipated that  the patient will be medically stable for discharge from the hospital within 2 midnights of admission. The following factors support the patient status of inpatient.   " The patient's presenting symptoms include hypoxemia, shortness of breath " The worrisome physical exam findings include hypoxemia " The initial radiographic and laboratory data are worrisome because of bilateral interstitial infiltrates " The chronic co-morbidities include dementia, chronic diastolic heart failure, A. fib   * I certify that at the point of admission it is my clinical judgment that the patient will require inpatient hospital care spanning beyond 2 midnights from the point of admission due to high intensity of service, high risk for further deterioration and high frequency of surveillance required.  Oren Binet [facility] Pager [phone]  If 7PM-7AM, please contact night-coverage  Please page via [email or web address]  Go to [email or web address] and  use [place]'s universal password to access. If you do not have the password, please contact the hospital operator.  Locate the [facility] provider you are looking for under [facility] and page to a number that you can be directly reached. If you still have difficulty reaching the provider, please page the [facility] (Director on Call) for the Hospitalists listed on amion for assistance.  07/11/2019, 1:43 PM

## 2019-07-11 NOTE — Progress Notes (Addendum)
Remdesivir - Pharmacy Brief Note   O:  ALT: 17 CXR: ip SpO2: 99% on 2L [place]   A/P:  Remdesivir 200 mg once followed by 100 mg daily x 4 days.   Gretta Arab PharmD, BCPS Clinical pharmacist phone 7am- 5pm: [phone] 07/11/2019 11:54 AM   Addendum: Remdesivir was d/c after loading dose 10/8 as pt was DNR. Now to be restarted to finish out the course so 4 more doses.  Sherlon Handing, PharmD, BCPS CGV Clinical pharmacist phone [phone] 07/13/2019 7:15 AM

## 2019-07-11 NOTE — Plan of Care (Signed)
  Problem: Coping: Goal: Psychosocial and spiritual needs will be supported Outcome: Progressing   Problem: Respiratory: Goal: Will maintain a patent airway Outcome: Progressing Goal: Complications related to the disease process, condition or treatment will be avoided or minimized Outcome: Progressing   

## 2019-07-12 ENCOUNTER — Encounter (HOSPITAL_COMMUNITY): Payer: Self-pay | Admitting: *Deleted

## 2019-07-12 ENCOUNTER — Other Ambulatory Visit: Payer: Self-pay

## 2019-07-12 DIAGNOSIS — J9601 Acute respiratory failure with hypoxia: Secondary | ICD-10-CM

## 2019-07-12 LAB — COMPREHENSIVE METABOLIC PANEL
ALT: 15 U/L (ref 0–44)
AST: 11 U/L — ABNORMAL LOW (ref 15–41)
Albumin: 2.8 g/dL — ABNORMAL LOW (ref 3.5–5.0)
Alkaline Phosphatase: 116 U/L (ref 38–126)
Anion gap: 12 (ref 5–15)
BUN: 26 mg/dL — ABNORMAL HIGH (ref 8–23)
CO2: 25 mmol/L (ref 22–32)
Calcium: 8.2 mg/dL — ABNORMAL LOW (ref 8.9–10.3)
Chloride: 101 mmol/L (ref 98–111)
Creatinine, Ser: 1.3 mg/dL — ABNORMAL HIGH (ref 0.44–1.00)
GFR calc Af Amer: 41 mL/min — ABNORMAL LOW (ref >60)
GFR calc non Af Amer: 36 mL/min — ABNORMAL LOW (ref >60)
Glucose, Bld: 130 mg/dL — ABNORMAL HIGH (ref 70–99)
Potassium: 4.1 mmol/L (ref 3.5–5.1)
Sodium: 138 mmol/L (ref 135–145)
Total Bilirubin: 0.8 mg/dL (ref 0.3–1.2)
Total Protein: 5.7 g/dL — ABNORMAL LOW (ref 6.5–8.1)

## 2019-07-12 LAB — CBC WITH DIFFERENTIAL/PLATELET
Abs Immature Granulocytes: 0.03 10*3/uL (ref 0.00–0.07)
Basophils Absolute: 0 10*3/uL (ref 0.0–0.1)
Basophils Relative: 0 %
Eosinophils Absolute: 0 10*3/uL (ref 0.0–0.5)
Eosinophils Relative: 0 %
HCT: 30.6 % — ABNORMAL LOW (ref 36.0–46.0)
Hemoglobin: 9.5 g/dL — ABNORMAL LOW (ref 12.0–15.0)
Immature Granulocytes: 1 %
Lymphocytes Relative: 4 %
Lymphs Abs: 0.2 10*3/uL — ABNORMAL LOW (ref 0.7–4.0)
MCH: 28.8 pg (ref 26.0–34.0)
MCHC: 31 g/dL (ref 30.0–36.0)
MCV: 92.7 fL (ref 80.0–100.0)
Monocytes Absolute: 0.1 10*3/uL (ref 0.1–1.0)
Monocytes Relative: 1 %
Neutro Abs: 5.3 10*3/uL (ref 1.7–7.7)
Neutrophils Relative %: 94 %
Platelets: 272 10*3/uL (ref 150–400)
RBC: 3.3 MIL/uL — ABNORMAL LOW (ref 3.87–5.11)
RDW: 14.4 % (ref 11.5–15.5)
WBC: 5.6 10*3/uL (ref 4.0–10.5)
nRBC: 0 % (ref 0.0–0.2)

## 2019-07-12 LAB — C-REACTIVE PROTEIN: CRP: 0.9 mg/dL (ref <1.0)

## 2019-07-12 LAB — D-DIMER, QUANTITATIVE: D-Dimer, Quant: 0.92 ug/mL-FEU — ABNORMAL HIGH (ref 0.00–0.50)

## 2019-07-12 LAB — FERRITIN: Ferritin: 41 ng/mL (ref 11–307)

## 2019-07-12 LAB — MRSA PCR SCREENING: MRSA by PCR: NEGATIVE

## 2019-07-12 MED ORDER — AMIODARONE HCL 100 MG PO TABS: 200.0000 mg | ORAL_TABLET | Freq: Two times a day (BID) | ORAL | Status: DC

## 2019-07-12 MED ORDER — APIXABAN 2.5 MG PO TABS: 2.5000 mg | ORAL_TABLET | Freq: Two times a day (BID) | ORAL | Status: DC

## 2019-07-12 MED ORDER — FERROUS SULFATE 325 (65 FE) MG PO TABS
325.0000 mg | ORAL_TABLET | Freq: Every day | ORAL | Status: DC
  Administered 2019-07-12 – 2019-07-16 (×5): 325 mg via ORAL
  Filled 2019-07-12 (×5): qty 1

## 2019-07-12 MED ORDER — DEXAMETHASONE 6 MG PO TABS
6.0000 mg | ORAL_TABLET | Freq: Every day | ORAL | Status: DC
  Administered 2019-07-12: 6 mg via ORAL
  Filled 2019-07-12: qty 1

## 2019-07-12 MED ORDER — AMLODIPINE BESYLATE 5 MG PO TABS
5.0000 mg | ORAL_TABLET | Freq: Every day | ORAL | Status: DC
  Administered 2019-07-12 – 2019-07-16 (×5): 5 mg via ORAL
  Filled 2019-07-12 (×6): qty 1

## 2019-07-12 MED ORDER — FUROSEMIDE 20 MG PO TABS
20.0000 mg | ORAL_TABLET | Freq: Every day | ORAL | Status: DC
  Administered 2019-07-13 – 2019-07-16 (×4): 20 mg via ORAL
  Filled 2019-07-12 (×4): qty 1

## 2019-07-12 MED ORDER — DIGOXIN 125 MCG PO TABS: 0.1250 mg | ORAL_TABLET | Freq: Every day | ORAL | Status: DC

## 2019-07-12 NOTE — Progress Notes (Signed)
PT Evaluation      07/12/19 1100  PT Visit Information  Last PT Received On 07/12/19  Assistance Needed +1  History of Present Illness 85 y/o f w/ hx of cognitive deficits, dementia, DM II, HTN,  HLD, mitral regurgitation, tricuspid regurgitation CHF, perm a-fib, CKD III, B shoulder surgeries. 10/01 tested + COVID  Precautions  Precautions Fall  Restrictions  Weight Bearing Restrictions No  Pain Assessment  Pain Assessment No/denies pain  Cognition  Arousal/Alertness Awake/alert  Behavior During Therapy WFL for tasks assessed/performed  Overall Cognitive Status History of cognitive impairments - at baseline  Bed Mobility  Overal bed mobility Needs Assistance  Bed Mobility Supine to Sit  Supine to sit Mod assist  General bed mobility comments able to get from supine to sit with mod a, sits unusupported EOB  Transfers  Overall transfer level Needs assistance  Transfers Sit to/from Stand;Stand Pivot Transfers  Sit to Stand Mod assist  Stand pivot transfers Max assist  General transfer comment able to use BUE to pull to stand x 2, then stand and pivot to recliner  Ambulation/Gait  General Gait Details attempted to take some side steps at EOB, able to minimally side step with LLE but unable to move RLE w/ this activity  Balance  Overall balance assessment Needs assistance  Sitting-balance support No upper extremity supported;Feet supported  Sitting balance-Leahy Scale Fair  Standing balance support Bilateral upper extremity supported  Standing balance-Leahy Scale Poor  General Comments  General comments (skin integrity, edema, etc.) Pt moving much more than expected, she is able to get from supine to sit with mod a , sitting EOB unsupported x 10 mins, sit<>stand x 3 with HHA and mod a, stand pivot transfer from bed to recliner with max a. Pt is in room air throughout and sats remain in 90s throughout.  PT - End of Session  Activity Tolerance Patient limited by fatigue  Patient  left in chair;with call bell/phone within reach  Nurse Communication Mobility status   PT - Assessment/Plan  PT Visit Diagnosis Other abnormalities of gait and mobility (R26.89);Muscle weakness (generalized) (M62.81)  PT Frequency (ACUTE ONLY) Min 2X/week  Follow Up Recommendations SNF  PT equipment None recommended by PT  AM-PAC PT "6 Clicks" Mobility Outcome Measure (Version 2)  Help needed turning from your back to your side while in a flat bed without using bedrails? 3  Help needed moving from lying on your back to sitting on the side of a flat bed without using bedrails? 3  Help needed moving to and from a bed to a chair (including a wheelchair)? 2  Help needed standing up from a chair using your arms (e.g., wheelchair or bedside chair)? 2  Help needed to walk in hospital room? 2  Help needed climbing 3-5 steps with a railing?  1  6 Click Score 13  Consider Recommendation of Discharge To: CIR/SNF/LTACH  Acute Rehab PT Goals  Time For Goal Achievement 07/26/19  Potential to Achieve Goals Fair  PT Time Calculation  PT Start Time (ACUTE ONLY) 0840  PT Stop Time (ACUTE ONLY) 0908  PT Time Calculation (min) (ACUTE ONLY) 28 min  PT General Charges  $$ ACUTE PT VISIT 1 Visit  PT Evaluation  $PT Eval Moderate Complexity 1 Mod  PT Treatments  $Therapeutic Activity 8-22 mins   Horald Chestnut, PT

## 2019-07-12 NOTE — Evaluation (Signed)
Clinical/Bedside Swallow Evaluation Patient Details  Name: [name] MRN: [id number] Date of Birth: Apr 13, 1925  Today's Date: 07/12/2019 Time: SLP Start Time (ACUTE ONLY): 1440 SLP Stop Time (ACUTE ONLY): 1455 SLP Time Calculation (min) (ACUTE ONLY): 15 min  Past Medical History:  Past Medical History:  Diagnosis Date  . Aortic insufficiency   . Chronic atrial fibrillation (HCC)    echo 9/09 EF 60%, mild MR  . Cognitive deficits 08/07/2013  . Dementia ([place])   . Diabetes mellitus   . Ectopic atrial tachycardia ([place])   . HTN (hypertension)   . Hyperlipidemia   . Mild mitral regurgitation   . Moderate tricuspid regurgitation    Past Surgical History:  Past Surgical History:  Procedure Laterality Date  . APPENDECTOMY    . SHOULDER SURGERY     Bilateral   HPI:  [name] is an 85 y.o. female past medical history of chronic diastolic heart failure permanent atrial fibrillation, chronic kidney disease 3 was recently discharged from [facility] last month for heart failure and atrial fibrillation discharged to skilled randomly tested for COVID-19 was found positive but remained asymptomatic until the day prior to admission when she started having low-grade fever cough and shortness of breath was placed on oxygen and transferred to [facility] ED. 2020 note from Dr. Jannifer Franklin does not mention a hx of dypshagia, says oral intake is good, pt able to self feed.    Assessment / Plan / Recommendation Clinical Impression  Pt demonstrates normal swallowing ability with no signs of aspiration. Recommend pt consume thin liquids and regular solids. Set up assist may be needed.  SLP Visit Diagnosis: Dysphagia, unspecified (R13.10)    Aspiration Risk  Mild aspiration risk    Diet Recommendation Regular;Thin liquid   Liquid Administration via: Cup;Straw Medication Administration: Whole meds with liquid Supervision: Staff to assist with self feeding Postural Changes: Seated upright at  90 degrees    Other  Recommendations     Follow up Recommendations None      Frequency and Duration            Prognosis        Swallow Study   General HPI: [name] is an 85 y.o. female past medical history of chronic diastolic heart failure permanent atrial fibrillation, chronic kidney disease 3 was recently discharged from [facility] last month for heart failure and atrial fibrillation discharged to skilled randomly tested for COVID-19 was found positive but remained asymptomatic until the day prior to admission when she started having low-grade fever cough and shortness of breath was placed on oxygen and transferred to [facility] ED. 2020 note from Dr. Jannifer Franklin does not mention a hx of dypshagia, says oral intake is good, pt able to self feed.  Type of Study: Bedside Swallow Evaluation Previous Swallow Assessment: none Diet Prior to this Study: NPO Temperature Spikes Noted: No Respiratory Status: Room air History of Recent Intubation: No Behavior/Cognition: Alert;Cooperative;Pleasant mood;Confused Oral Cavity Assessment: Within Functional Limits Oral Care Completed by SLP: No Oral Cavity - Dentition: Adequate natural dentition Vision: Functional for self-feeding Self-Feeding Abilities: Able to feed self Patient Positioning: Upright in bed Baseline Vocal Quality: Normal Volitional Cough: Strong Volitional Swallow: Able to elicit    Oral/Motor/Sensory Function Overall Oral Motor/Sensory Function: Within functional limits   Ice Chips     Thin Liquid Thin Liquid: Within functional limits Presentation: Straw    Nectar Thick Nectar Thick Liquid: Not tested   Honey Thick Honey Thick Liquid: Not  tested   Puree Puree: Within functional limits   Solid     Solid: Within functional limits Presentation: Delton Jala Dundon, MA [place] Pager [phone] Office [phone]  Othelia Pulling, Katherene Ponto 07/12/2019,4:23  PM

## 2019-07-12 NOTE — Progress Notes (Addendum)
[facility] PROGRESS NOTE    Progress Note  [name]  [id number] DOB: [birth year] DOA: 07/11/2019 PCP: Nicoletta Dress, MD     Brief Narrative:   [name] is an 85 y.o. female past medical history of chronic diastolic heart failure permanent atrial fibrillation, chronic kidney disease 3 was recently discharged from [facility] last month for heart failure and atrial fibrillation discharged to skilled randomly tested for COVID-19 was found positive but remained asymptomatic until the day prior to admission when she started having low-grade fever cough and shortness of breath was placed on oxygen and transferred to [facility] ED, where she was retested and it was negative [facility] felt this was a false negative as she had features consistent with COVID-19 and a test on 07/04/2019 was positive and transferred to [facility].  Assessment/Plan:   Acute respiratory failure with hypoxia likely due to COVID-19 viral infection: She was found to be hypoxic at [place]. She is frailed and deconditioning she is probably not a candidate for intubation or further escalation of care.  Admitting physician discussed with the daughter and she was made a DNR. She has been weaned down to room air satting 93%. Has remained afebrile inflammatory markers are barely elevated.  Permanent atrial fibrillation:  Continue amiodarone, digoxin, and Eliquis.  Chronic diastolic heart failure: Is appears euvolemic, she was given 1 single dose of Lasix and she remained negative.  We will resume her home dose of Lasix.  Dementia without behavioral disturbances: Continue Namenda and Aricept. Very high risk for aspiration and acute delirium.  Aortic insufficiency: Noted.  Goals of care/palliative care: She is a poor candidate for aggressive therapy the admitting physician discussed with the daughter she was made a DNR/DNI.  We will continue gentle treatment.   Memory loss    DVT prophylaxis: eliquis Family Communication:daughter Disposition Plan/Barrier to D/C: in 5 days Code Status:     Code Status Orders  (From admission, onward)         Start     Ordered   07/11/19 1255  Do not attempt resuscitation (DNR)  Continuous    Question Answer Comment  In the event of cardiac or respiratory ARREST Do not call a "code blue"   In the event of cardiac or respiratory ARREST Do not perform Intubation, CPR, defibrillation or ACLS   In the event of cardiac or respiratory ARREST Use medication by any route, position, wound care, and other measures to relive pain and suffering. May use oxygen, suction and manual treatment of airway obstruction as needed for comfort.      07/11/19 1254        Code Status History    Date Active Date Inactive Code Status Order ID Comments User Context   07/11/2019 1146 07/11/2019 1254 Full Code [id number]  Jonetta Osgood, MD Inpatient   Advance Care Planning Activity        IV Access:    Peripheral IV   Procedures and diagnostic studies:   Portable Chest 1 View  Result Date: 07/11/2019 CLINICAL DATA:  Shortness of breath EXAM: PORTABLE CHEST 1 VIEW COMPARISON:  07/10/2019 FINDINGS: The patient is rotated to the right on today's radiograph, reducing diagnostic sensitivity and specificity. Atherosclerotic calcification of the aortic arch. Stable cardiomegaly. Blunting of both costophrenic angles with indistinctness of the left hemidiaphragm, likely from small bilateral pleural effusions and passive atelectasis. Interstitial accentuation probably from interstitial edema. Healing right upper rib fractures including the fourth  and fifth rib and possibly the third rib. Lower right rib fractures are probably old. No pneumothorax. Thoracic spondylosis. Bony demineralization. IMPRESSION: 1. Cardiomegaly with interstitial edema and small bilateral pleural effusions. 2. Healing right upper rib fractures. 3. Bony  demineralization. 4. Thoracic spondylosis. Electronically Signed   By: Van Clines M.D.   On: 07/11/2019 15:08     Medical Consultants:    None.  Anti-Infectives:   Remdesivir  Subjective:    Michaelyn Barter has no new complaints this morning.  Objective:    Vitals:   07/12/19 0300 07/12/19 0400 07/12/19 0500 07/12/19 0600  BP: (!) 149/59 (!) 159/73 (!) 150/65 (!) 146/90  Pulse: 66 66 66 94  Resp: 12 13 16  (!) 26  Temp:  98.4 F (36.9 C)    TempSrc:  Oral    SpO2: 92% 93% 92% 95%  Weight:  58.2 kg    Height:  5\' 5"  (1.651 m)     SpO2: 95 % O2 Flow Rate (L/min): (S) 1 L/min   Intake/Output Summary (Last 24 hours) at 07/12/2019 0724 Last data filed at 07/12/2019 0600 Gross per 24 hour  Intake 493 ml  Output 940 ml  Net -447 ml   Filed Weights   07/12/19 0400  Weight: 58.2 kg    Exam: General exam: In no acute distress. Respiratory system: Good air movement and diffuse crackles at bases.. Cardiovascular system: S1 & S2 heard, RRR. No JVD. Gastrointestinal system: Abdomen is nondistended, soft and nontender.  Central nervous system: Alert and oriented. No focal neurological deficits. Extremities: No pedal edema. Skin: No rashes, lesions or ulcers Psychiatry: Judgement and insight appear normal. Mood & affect appropriate.   Data Reviewed:    Labs: Basic Metabolic Panel: Recent Labs  Lab 07/11/19 1230 07/12/19 0200  NA 136 138  K 3.6 4.1  CL 98 101  CO2 25 25  GLUCOSE 125* 130*  BUN 21 26*  CREATININE 1.40* 1.30*  CALCIUM 8.3* 8.2*   GFR Estimated Creatinine Clearance: 24.8 mL/min (A) (by C-G formula based on SCr of 1.3 mg/dL (H)). Liver Function Tests: Recent Labs  Lab 07/11/19 1230 07/12/19 0200  AST 13* 11*  ALT 17 15  ALKPHOS 133* 116  BILITOT 1.1 0.8  PROT 6.2* 5.7*  ALBUMIN 3.1* 2.8*   No results for input(s): LIPASE, AMYLASE in the last 168 hours. No results for input(s): AMMONIA in the last 168 hours. Coagulation  profile No results for input(s): INR, PROTIME in the last 168 hours. COVID-19 Labs  Recent Labs    07/11/19 1230 07/12/19 0200  DDIMER 1.01* 0.92*  FERRITIN  --  41  LDH 193*  --   CRP 1.2* 0.9    No results found for: SARSCOV2NAA  CBC: Recent Labs  Lab 07/11/19 1230 07/12/19 0200  WBC 5.6 5.6  NEUTROABS 5.1 5.3  HGB 10.0* 9.5*  HCT 32.1* 30.6*  MCV 92.0 92.7  PLT 306 272   Cardiac Enzymes: No results for input(s): CKTOTAL, CKMB, CKMBINDEX, TROPONINI in the last 168 hours. BNP (last 3 results) No results for input(s): PROBNP in the last 8760 hours. CBG: No results for input(s): GLUCAP in the last 168 hours. D-Dimer: Recent Labs    07/11/19 1230 07/12/19 0200  DDIMER 1.01* 0.92*   Hgb A1c: No results for input(s): HGBA1C in the last 72 hours. Lipid Profile: No results for input(s): CHOL, HDL, LDLCALC, TRIG, CHOLHDL, LDLDIRECT in the last 72 hours. Thyroid function studies: No results for input(s): TSH, T4TOTAL,  T3FREE, THYROIDAB in the last 72 hours.  Invalid input(s): FREET3 Anemia work up: Recent Labs    07/12/19 0200  FERRITIN 41   Sepsis Labs: Recent Labs  Lab 07/11/19 1230 07/12/19 0200  PROCALCITON <0.10  --   WBC 5.6 5.6   Microbiology Recent Results (from the past 240 hour(s))  MRSA PCR Screening     Status: None   Collection Time: 07/11/19 11:07 PM   Specimen: Nasopharyngeal  Result Value Ref Range Status   MRSA by PCR NEGATIVE NEGATIVE Final    Comment:        The GeneXpert MRSA Assay (FDA approved for NASAL specimens only), is one component of a comprehensive MRSA colonization surveillance program. It is not intended to diagnose MRSA infection nor to guide or monitor treatment for MRSA infections. Performed at [facility], [place] [address], [place], [place] [address]      Medications:   . amiodarone  200 mg Oral BID  . apixaban  2.5 mg Oral BID  . atorvastatin  40 mg Oral QHS  . Chlorhexidine  Gluconate Cloth  6 each Topical Daily  . digoxin  0.125 mg Oral Daily  . donepezil  5 mg Oral QHS  . furosemide  40 mg Oral Daily  . memantine  10 mg Oral BID  . methylPREDNISolone (SOLU-MEDROL) injection  40 mg Intravenous Q12H  . metoprolol tartrate  12.5 mg Oral BID  . potassium chloride SA  40 mEq Oral Daily  . risperiDONE  1 mg Oral QHS  . sodium chloride flush  3 mL Intravenous Q12H  . vitamin C  500 mg Oral Daily  . zinc sulfate  220 mg Oral Daily   Continuous Infusions: . sodium chloride    . remdesivir 100 mg in NS 250 mL        LOS: 1 day   Charlynne Cousins  [facility]  07/12/2019, 7:24 AM

## 2019-07-12 NOTE — Progress Notes (Signed)
Physical Therapy Treatment Patient Details Name: [name] MRN: [id number] DOB: 16-Sep-1925 Today's Date: 07/12/2019    History of Present Illness 85 y/o f w/ hx of cognitive deficits, dementia, DM II, HTN,  HLD, mitral regurgitation, tricuspid regurgitation CHF, perm a-fib, CKD III, B shoulder surgeries. 10/01 tested + COVID    PT Comments    Pt found attempting to get out of chair and back to bed. She seems confused but not able to complete much on her own. Attempted with cues but unable to complete without max a this pm. She does seem to be more confused this pm than this am.    Follow Up Recommendations  SNF     Equipment Recommendations       Recommendations for Other Services       Precautions / Restrictions Precautions Precautions: Fall Restrictions Weight Bearing Restrictions: No    Mobility  Bed Mobility Overal bed mobility: Needs Assistance Bed Mobility: Sit to Supine     Supine to sit: Max assist     General bed mobility comments: from sitting EOB attempted to cue to complete log roll but unable to follow cues, needs max a to complete, max a to scoot up in bed into position.  Transfers Overall transfer level: Needs assistance Equipment used: 1 person hand held assist Transfers: Stand Pivot Transfers Sit to Stand: Mod assist Stand pivot transfers: Max assist       General transfer comment: able to pull to stand again with mod a but then does nothing to assist with pivot back to bed, stating i cant.  Ambulation/Gait                 Stairs             Wheelchair Mobility    Modified Rankin (Stroke Patients Only)       Balance                                            Cognition Arousal/Alertness: Awake/alert Behavior During Therapy: Restless;Agitated Overall Cognitive Status: History of cognitive impairments - at baseline                                        Exercises       General Comments General comments (skin integrity, edema, etc.): Pt found hanging out of chair, asked what was happening stated that she was trying to get herself up and do something. attempted several times to work on repositioning in recliner but even with effort pt unable to scoot self back in chair. attempted to cue verbally and tactile to complete but unable to, was able to give some effort in pull to stand from recliner but then needs max a for stand pivot back to bed. sitting EOB states I'm going off, therapist again attempted to cue to correct posture and hence not slide down but again pt just states I cant. assisted to bed and worked on rolling L and R, Pt attempts to scoot in bed in sidelying, cued to return to supine and attempt scoot again, minimally able to move, max a to scoot up into position.      Pertinent Vitals/Pain Pain Assessment: No/denies pain    Home Living  Prior Function            PT Goals (current goals can now be found in the care plan section) Acute Rehab PT Goals Time For Goal Achievement: 07/26/19 Potential to Achieve Goals: Fair    Frequency    Min 2X/week      PT Plan      Co-evaluation              AM-PAC PT "6 Clicks" Mobility   Outcome Measure  Help needed turning from your back to your side while in a flat bed without using bedrails?: A Little Help needed moving from lying on your back to sitting on the side of a flat bed without using bedrails?: A Little Help needed moving to and from a bed to a chair (including a wheelchair)?: A Lot Help needed standing up from a chair using your arms (e.g., wheelchair or bedside chair)?: A Lot Help needed to walk in hospital room?: A Lot Help needed climbing 3-5 steps with a railing? : Total 6 Click Score: 13    End of Session   Activity Tolerance: Patient limited by fatigue;Other (comment)(perhapse cognition also) Patient left: in bed;with call bell/phone within  reach;with bed alarm set   PT Visit Diagnosis: Other abnormalities of gait and mobility (R26.89);Muscle weakness (generalized) (M62.81)     Time: [id number] PT Time Calculation (min) (ACUTE ONLY): 8 min  Charges:  $Therapeutic Activity: 68-82 mins                     Horald Chestnut, PT    Delford Field 07/12/2019, 3:44 PM

## 2019-07-13 LAB — CBC WITH DIFFERENTIAL/PLATELET
Abs Immature Granulocytes: 0.03 10*3/uL (ref 0.00–0.07)
Basophils Absolute: 0 10*3/uL (ref 0.0–0.1)
Basophils Relative: 0 %
Eosinophils Absolute: 0 10*3/uL (ref 0.0–0.5)
Eosinophils Relative: 0 %
HCT: 34.6 % — ABNORMAL LOW (ref 36.0–46.0)
Hemoglobin: 10.6 g/dL — ABNORMAL LOW (ref 12.0–15.0)
Immature Granulocytes: 0 %
Lymphocytes Relative: 4 %
Lymphs Abs: 0.4 10*3/uL — ABNORMAL LOW (ref 0.7–4.0)
MCH: 28.3 pg (ref 26.0–34.0)
MCHC: 30.6 g/dL (ref 30.0–36.0)
MCV: 92.5 fL (ref 80.0–100.0)
Monocytes Absolute: 0.5 10*3/uL (ref 0.1–1.0)
Monocytes Relative: 6 %
Neutro Abs: 7.3 10*3/uL (ref 1.7–7.7)
Neutrophils Relative %: 90 %
Platelets: 343 10*3/uL (ref 150–400)
RBC: 3.74 MIL/uL — ABNORMAL LOW (ref 3.87–5.11)
RDW: 14.9 % (ref 11.5–15.5)
WBC: 8.1 10*3/uL (ref 4.0–10.5)
nRBC: 0 % (ref 0.0–0.2)

## 2019-07-13 LAB — COMPREHENSIVE METABOLIC PANEL
ALT: 17 U/L (ref 0–44)
AST: 13 U/L — ABNORMAL LOW (ref 15–41)
Albumin: 3.4 g/dL — ABNORMAL LOW (ref 3.5–5.0)
Alkaline Phosphatase: 118 U/L (ref 38–126)
Anion gap: 11 (ref 5–15)
BUN: 41 mg/dL — ABNORMAL HIGH (ref 8–23)
CO2: 27 mmol/L (ref 22–32)
Calcium: 8.6 mg/dL — ABNORMAL LOW (ref 8.9–10.3)
Chloride: 101 mmol/L (ref 98–111)
Creatinine, Ser: 1.42 mg/dL — ABNORMAL HIGH (ref 0.44–1.00)
GFR calc Af Amer: 37 mL/min — ABNORMAL LOW (ref >60)
GFR calc non Af Amer: 32 mL/min — ABNORMAL LOW (ref >60)
Glucose, Bld: 137 mg/dL — ABNORMAL HIGH (ref 70–99)
Potassium: 4.8 mmol/L (ref 3.5–5.1)
Sodium: 139 mmol/L (ref 135–145)
Total Bilirubin: 0.7 mg/dL (ref 0.3–1.2)
Total Protein: 6.4 g/dL — ABNORMAL LOW (ref 6.5–8.1)

## 2019-07-13 LAB — C-REACTIVE PROTEIN: CRP: <0.8 mg/dL (ref <1.0)

## 2019-07-13 LAB — D-DIMER, QUANTITATIVE: D-Dimer, Quant: 1.33 ug/mL-FEU — ABNORMAL HIGH (ref 0.00–0.50)

## 2019-07-13 LAB — FERRITIN: Ferritin: 52 ng/mL (ref 11–307)

## 2019-07-13 MED ORDER — DEXAMETHASONE 6 MG PO TABS
6.0000 mg | ORAL_TABLET | Freq: Every day | ORAL | Status: DC
  Administered 2019-07-13 – 2019-07-16 (×4): 6 mg via ORAL
  Filled 2019-07-13 (×4): qty 1

## 2019-07-13 MED ORDER — SODIUM CHLORIDE 0.9 % IV SOLN
100.0000 mg | INTRAVENOUS | Status: AC
  Administered 2019-07-13 – 2019-07-16 (×4): 100 mg via INTRAVENOUS
  Filled 2019-07-13 (×4): qty 20

## 2019-07-13 NOTE — NC FL2 (Signed)
[place] MEDICAID FL2 LEVEL OF CARE SCREENING TOOL     IDENTIFICATION  Patient Name: [name] Birthdate: May 26, 1925 Sex: female Admission Date (Current Location): 07/11/2019  [facility] and [place] Number:  Herbalist and Address:  [facility], [place] [address], [place],  [address]      Provider Number: [id number]  Attending Physician Name and Address:  Charlynne Cousins, MD  Relative Name and Phone Number:       Current Level of Care: Hospital Recommended Level of Care: [place] Prior Approval Number:    Date Approved/Denied:   PASRR Number: [id number]  Discharge Plan: SNF    Current Diagnoses: Patient Active Problem List   Diagnosis Date Noted  . Acute respiratory failure with hypoxia ([place]) 07/12/2019  . Pneumonia due to COVID-19 virus 07/11/2019  . Aortic insufficiency 05/21/2019  . Cognitive deficits 08/07/2013  . Chronic diastolic heart failure ([place]) 03/25/2011  . Acute diastolic heart failure ([place]) 03/14/2011  . DYSPNEA 09/03/2010  . LIMB PAIN 03/03/2010  . Memory loss 09/03/2009  . HYPERLIPIDEMIA 01/25/2009  . HYPERTENSION, BENIGN 01/25/2009  . ATRIAL FIBRILLATION 01/25/2009    Orientation RESPIRATION BLADDER Height & Weight     Self  Normal Indwelling catheter Weight: 128 lb 4.9 oz (58.2 kg) Height:  5\' 5"  (165.1 cm)  BEHAVIORAL SYMPTOMS/MOOD NEUROLOGICAL BOWEL NUTRITION STATUS      Continent Diet(regular)  AMBULATORY STATUS COMMUNICATION OF NEEDS Skin   Extensive Assist Verbally Normal                       Personal Care Assistance Level of Assistance  Bathing, Dressing, Feeding Bathing Assistance: Maximum assistance Feeding assistance: Limited assistance Dressing Assistance: Maximum assistance     Functional Limitations Info  Sight, Speech, Hearing Sight Info: Adequate Hearing Info: Adequate Speech Info: Adequate    SPECIAL CARE FACTORS FREQUENCY  PT (By licensed PT),  OT (By licensed OT)     PT Frequency: 5x/wk OT Frequency: 5x/wk            Contractures Contractures Info: Not present    Additional Factors Info  Code Status, Allergies, Psychotropic Code Status Info: DNR Allergies Info: Aspirin Psychotropic Info: Aricept 5mg  daily at bed; Risperdal 1mg  daily at bed         Current Medications (07/13/2019):  This is the current hospital active medication list Current Facility-Administered Medications  Medication Dose Route Frequency Provider Last Rate Last Dose  . 0.9 %  sodium chloride infusion  250 mL Intravenous PRN Jonetta Osgood, MD      . acetaminophen (TYLENOL) tablet 650 mg  650 mg Oral Q6H PRN Jonetta Osgood, MD      . amiodarone (PACERONE) tablet 200 mg  200 mg Oral BID Jonetta Osgood, MD   200 mg at 07/13/19 1006  . amLODipine (NORVASC) tablet 5 mg  5 mg Oral Daily Charlynne Cousins, MD   5 mg at 07/13/19 1006  . apixaban (ELIQUIS) tablet 2.5 mg  2.5 mg Oral BID Jonetta Osgood, MD   2.5 mg at 07/13/19 1005  . atorvastatin (LIPITOR) tablet 40 mg  40 mg Oral QHS Jonetta Osgood, MD   40 mg at 07/12/19 2149  . Chlorhexidine Gluconate Cloth 2 % PADS 6 each  6 each Topical Daily Jonetta Osgood, MD   6 each at 07/13/19 1020  . chlorpheniramine-HYDROcodone (TUSSIONEX) 10-8 MG/5ML suspension 5 mL  5 mL  Oral Q12H PRN Jonetta Osgood, MD      . dexamethasone (DECADRON) tablet 6 mg  6 mg Oral Daily Charlynne Cousins, MD   6 mg at 07/13/19 1005  . digoxin (LANOXIN) tablet 0.125 mg  0.125 mg Oral Daily Jonetta Osgood, MD   0.125 mg at 07/13/19 1006  . donepezil (ARICEPT) tablet 5 mg  5 mg Oral QHS Jonetta Osgood, MD   5 mg at 07/12/19 2150  . ferrous sulfate tablet 325 mg  325 mg Oral Q breakfast Charlynne Cousins, MD   325 mg at 07/13/19 1005  . furosemide (LASIX) tablet 20 mg  20 mg Oral Daily Charlynne Cousins, MD   20 mg at 07/13/19 1006  . guaiFENesin-dextromethorphan (ROBITUSSIN DM) 100-10 MG/5ML  syrup 10 mL  10 mL Oral Q4H PRN Jonetta Osgood, MD      . memantine [facility]) tablet 10 mg  10 mg Oral BID Jonetta Osgood, MD   10 mg at 07/13/19 1006  . metoprolol tartrate (LOPRESSOR) tablet 12.5 mg  12.5 mg Oral BID Jonetta Osgood, MD   12.5 mg at 07/13/19 1006  . ondansetron (ZOFRAN) tablet 4 mg  4 mg Oral Q6H PRN Jonetta Osgood, MD   4 mg at 07/13/19 0028   Or  . ondansetron [facility]) injection 4 mg  4 mg Intravenous Q6H PRN Jonetta Osgood, MD      . oxyCODONE (Oxy IR/ROXICODONE) immediate release tablet 5 mg  5 mg Oral Q4H PRN Jonetta Osgood, MD   5 mg at 07/13/19 0028  . polyethylene glycol (MIRALAX / GLYCOLAX) packet 17 g  17 g Oral Daily PRN Ghimire, Henreitta Leber, MD      . potassium chloride SA (KLOR-CON) CR tablet 40 mEq  40 mEq Oral Daily Jonetta Osgood, MD   40 mEq at 07/13/19 1005  . remdesivir 100 mg in sodium chloride 0.9 % 250 mL IVPB  100 mg Intravenous Q24H Franky Macho, RPH 500 mL/hr at 07/13/19 1015 100 mg at 07/13/19 1015  . risperiDONE (RISPERDAL) tablet 1 mg  1 mg Oral QHS Jonetta Osgood, MD   1 mg at 07/12/19 2149  . sodium chloride flush (NS) 0.9 % injection 3 mL  3 mL Intravenous Q12H Ghimire, Henreitta Leber, MD   3 mL at 07/13/19 1019  . sodium chloride flush (NS) 0.9 % injection 3 mL  3 mL Intravenous PRN Ghimire, Henreitta Leber, MD      . vitamin C (ASCORBIC ACID) tablet 500 mg  500 mg Oral Daily Jonetta Osgood, MD   500 mg at 07/13/19 1006  . zinc sulfate capsule 220 mg  220 mg Oral Daily Jonetta Osgood, MD   220 mg at 07/13/19 1006     Discharge Medications: Please see discharge summary for a list of discharge medications.  Relevant Imaging Results:  Relevant Lab Results:   Additional Information SS#: [id number]  Geralynn Ochs, LCSW

## 2019-07-13 NOTE — Progress Notes (Signed)
[facility] PROGRESS NOTE    Progress Note  [name]  [id number] DOB: June 28, 1925 DOA: 07/11/2019 PCP: Nicoletta Dress, MD     Brief Narrative:   [name] is an 85 y.o. female past medical history of chronic diastolic heart failure permanent atrial fibrillation on Eliquis, chronic kidney disease 3 was recently discharged from [facility] last month for heart failure and atrial fibrillation discharged to skilled randomly tested for COVID-19 was found positive but remained asymptomatic until the day prior to admission when she started having low-grade fever cough and shortness of breath was placed on oxygen and transferred to [facility] ED, where she was retested and it was negative the [facility] ED physician felt this was a false negative as she had features consistent with COVID-19 and a test on 07/04/2019 was positive and transferred to [facility] in [place]  Assessment/Plan:   Acute respiratory failure with hypoxia likely due to COVID-19 viral infection: She was found to be hypoxic at [place]. Frail 85 year old female that is not a candidate for intubation or further escalation of care. Continue oral steroids and Remdesivir, she is currently requiring 1 L of oxygen to keep saturations greater 93%.  Permanent atrial fibrillation:  Continue amiodarone, digoxin, and Eliquis.  Chronic diastolic heart failure: She appears to be euvolemic continue current home dose of Lasix.  Dementia without behavioral disturbances: Continue Namenda and Aricept. Very high risk for aspiration and acute delirium.  Aortic insufficiency: Noted.  Goals of care/palliative care: She is a poor candidate for aggressive therapy the admitting physician discussed with the daughter she was made a DNR/DNI.  We will continue gentle treatment.  Memory loss   DVT prophylaxis: Eliquis Family Communication:daughter Disposition Plan/Barrier to D/C: Once she  is done with Remdesivir. Code Status:     Code Status Orders  (From admission, onward)         Start     Ordered   07/11/19 1255  Do not attempt resuscitation (DNR)  Continuous    Question Answer Comment  In the event of cardiac or respiratory ARREST Do not call a "code blue"   In the event of cardiac or respiratory ARREST Do not perform Intubation, CPR, defibrillation or ACLS   In the event of cardiac or respiratory ARREST Use medication by any route, position, wound care, and other measures to relive pain and suffering. May use oxygen, suction and manual treatment of airway obstruction as needed for comfort.      07/11/19 1254        Code Status History    Date Active Date Inactive Code Status Order ID Comments User Context   07/11/2019 1146 07/11/2019 1254 Full Code [id number]  Jonetta Osgood, MD Inpatient   Advance Care Planning Activity        IV Access:    Peripheral IV   Procedures and diagnostic studies:   Portable Chest 1 View  Result Date: 07/11/2019 CLINICAL DATA:  Shortness of breath EXAM: PORTABLE CHEST 1 VIEW COMPARISON:  07/10/2019 FINDINGS: The patient is rotated to the right on today's radiograph, reducing diagnostic sensitivity and specificity. Atherosclerotic calcification of the aortic arch. Stable cardiomegaly. Blunting of both costophrenic angles with indistinctness of the left hemidiaphragm, likely from small bilateral pleural effusions and passive atelectasis. Interstitial accentuation probably from interstitial edema. Healing right upper rib fractures including the fourth and fifth rib and possibly the third rib. Lower right rib fractures are probably old. No pneumothorax. Thoracic spondylosis. Bony  demineralization. IMPRESSION: 1. Cardiomegaly with interstitial edema and small bilateral pleural effusions. 2. Healing right upper rib fractures. 3. Bony demineralization. 4. Thoracic spondylosis. Electronically Signed   By: Van Clines M.D.   On:  07/11/2019 15:08     Medical Consultants:    None.  Anti-Infectives:   Remdesivir  Subjective:    [name] she relates she feels great this morning.  Objective:    Vitals:   07/12/19 1426 07/12/19 1427 07/12/19 2028 07/13/19 0403  BP: (!) 176/83  (!) 173/82   Pulse:  67 69   Resp:      Temp:   97.6 F (36.4 C) (!) 97.3 F (36.3 C)  TempSrc:   Oral Axillary  SpO2:   97%   Weight:      Height:       SpO2: 97 % O2 Flow Rate (L/min): (S) 1 L/min   Intake/Output Summary (Last 24 hours) at 07/13/2019 0702 Last data filed at 07/12/2019 2229 Gross per 24 hour  Intake -  Output 650 ml  Net -650 ml   Filed Weights   07/12/19 0400  Weight: 58.2 kg    Exam: General exam: In no acute distress. Respiratory system: Good air movement and diffuse crackles at bases mostly on the right bilaterally. Cardiovascular system: S1 & S2 heard, RRR. No JVD. Gastrointestinal system: Abdomen is nondistended, soft and nontender.  Central nervous system: Alert and oriented. No focal neurological deficits. Extremities: No pedal edema. Skin: No rashes, lesions or ulcers Data Reviewed:    Labs: Basic Metabolic Panel: Recent Labs  Lab 07/11/19 1230 07/12/19 0200  NA 136 138  K 3.6 4.1  CL 98 101  CO2 25 25  GLUCOSE 125* 130*  BUN 21 26*  CREATININE 1.40* 1.30*  CALCIUM 8.3* 8.2*   GFR Estimated Creatinine Clearance: 24.8 mL/min (A) (by C-G formula based on SCr of 1.3 mg/dL (H)). Liver Function Tests: Recent Labs  Lab 07/11/19 1230 07/12/19 0200  AST 13* 11*  ALT 17 15  ALKPHOS 133* 116  BILITOT 1.1 0.8  PROT 6.2* 5.7*  ALBUMIN 3.1* 2.8*   No results for input(s): LIPASE, AMYLASE in the last 168 hours. No results for input(s): AMMONIA in the last 168 hours. Coagulation profile No results for input(s): INR, PROTIME in the last 168 hours. COVID-19 Labs  Recent Labs    07/11/19 1230 07/12/19 0200  DDIMER 1.01* 0.92*  FERRITIN  --  41  LDH 193*  --    CRP 1.2* 0.9    No results found for: SARSCOV2NAA  CBC: Recent Labs  Lab 07/11/19 1230 07/12/19 0200  WBC 5.6 5.6  NEUTROABS 5.1 5.3  HGB 10.0* 9.5*  HCT 32.1* 30.6*  MCV 92.0 92.7  PLT 306 272   Cardiac Enzymes: No results for input(s): CKTOTAL, CKMB, CKMBINDEX, TROPONINI in the last 168 hours. BNP (last 3 results) No results for input(s): PROBNP in the last 8760 hours. CBG: No results for input(s): GLUCAP in the last 168 hours. D-Dimer: Recent Labs    07/11/19 1230 07/12/19 0200  DDIMER 1.01* 0.92*   Hgb A1c: No results for input(s): HGBA1C in the last 72 hours. Lipid Profile: No results for input(s): CHOL, HDL, LDLCALC, TRIG, CHOLHDL, LDLDIRECT in the last 72 hours. Thyroid function studies: No results for input(s): TSH, T4TOTAL, T3FREE, THYROIDAB in the last 72 hours.  Invalid input(s): FREET3 Anemia work up: Recent Labs    07/12/19 0200  FERRITIN 41   Sepsis Labs: Recent Labs  Lab 07/11/19 1230 07/12/19 0200  PROCALCITON <0.10  --   WBC 5.6 5.6   Microbiology Recent Results (from the past 240 hour(s))  MRSA PCR Screening     Status: None   Collection Time: 07/11/19 11:07 PM   Specimen: Nasopharyngeal  Result Value Ref Range Status   MRSA by PCR NEGATIVE NEGATIVE Final    Comment:        The GeneXpert MRSA Assay (FDA approved for NASAL specimens only), is one component of a comprehensive MRSA colonization surveillance program. It is not intended to diagnose MRSA infection nor to guide or monitor treatment for MRSA infections. Performed at [facility], [place] [address], [place], [place] [address]      Medications:   . amiodarone  200 mg Oral BID  . amLODipine  5 mg Oral Daily  . apixaban  2.5 mg Oral BID  . atorvastatin  40 mg Oral QHS  . Chlorhexidine Gluconate Cloth  6 each Topical Daily  . digoxin  0.125 mg Oral Daily  . donepezil  5 mg Oral QHS  . ferrous sulfate  325 mg Oral Q breakfast  . furosemide  20 mg  Oral Daily  . memantine  10 mg Oral BID  . metoprolol tartrate  12.5 mg Oral BID  . potassium chloride SA  40 mEq Oral Daily  . risperiDONE  1 mg Oral QHS  . sodium chloride flush  3 mL Intravenous Q12H  . vitamin C  500 mg Oral Daily  . zinc sulfate  220 mg Oral Daily   Continuous Infusions: . sodium chloride        LOS: 2 days   Charlynne Cousins  [facility]  07/13/2019, 7:02 AM

## 2019-07-14 LAB — CBC WITH DIFFERENTIAL/PLATELET
Abs Immature Granulocytes: 0.04 10*3/uL (ref 0.00–0.07)
Basophils Absolute: 0 10*3/uL (ref 0.0–0.1)
Basophils Relative: 0 %
Eosinophils Absolute: 0 10*3/uL (ref 0.0–0.5)
Eosinophils Relative: 0 %
HCT: 35.4 % — ABNORMAL LOW (ref 36.0–46.0)
Hemoglobin: 10.9 g/dL — ABNORMAL LOW (ref 12.0–15.0)
Immature Granulocytes: 1 %
Lymphocytes Relative: 4 %
Lymphs Abs: 0.3 10*3/uL — ABNORMAL LOW (ref 0.7–4.0)
MCH: 29.1 pg (ref 26.0–34.0)
MCHC: 30.8 g/dL (ref 30.0–36.0)
MCV: 94.7 fL (ref 80.0–100.0)
Monocytes Absolute: 0.5 10*3/uL (ref 0.1–1.0)
Monocytes Relative: 7 %
Neutro Abs: 6.5 10*3/uL (ref 1.7–7.7)
Neutrophils Relative %: 88 %
Platelets: 271 10*3/uL (ref 150–400)
RBC: 3.74 MIL/uL — ABNORMAL LOW (ref 3.87–5.11)
RDW: 15 % (ref 11.5–15.5)
WBC: 7.3 10*3/uL (ref 4.0–10.5)
nRBC: 0 % (ref 0.0–0.2)

## 2019-07-14 LAB — COMPREHENSIVE METABOLIC PANEL
ALT: 17 U/L (ref 0–44)
AST: 18 U/L (ref 15–41)
Albumin: 3.6 g/dL (ref 3.5–5.0)
Alkaline Phosphatase: 111 U/L (ref 38–126)
Anion gap: 11 (ref 5–15)
BUN: 47 mg/dL — ABNORMAL HIGH (ref 8–23)
CO2: 27 mmol/L (ref 22–32)
Calcium: 8.7 mg/dL — ABNORMAL LOW (ref 8.9–10.3)
Chloride: 101 mmol/L (ref 98–111)
Creatinine, Ser: 1.41 mg/dL — ABNORMAL HIGH (ref 0.44–1.00)
GFR calc Af Amer: 37 mL/min — ABNORMAL LOW (ref >60)
GFR calc non Af Amer: 32 mL/min — ABNORMAL LOW (ref >60)
Glucose, Bld: 132 mg/dL — ABNORMAL HIGH (ref 70–99)
Potassium: 5.4 mmol/L — ABNORMAL HIGH (ref 3.5–5.1)
Sodium: 139 mmol/L (ref 135–145)
Total Bilirubin: 1.1 mg/dL (ref 0.3–1.2)
Total Protein: 6.5 g/dL (ref 6.5–8.1)

## 2019-07-14 LAB — D-DIMER, QUANTITATIVE: D-Dimer, Quant: 1.91 ug/mL-FEU — ABNORMAL HIGH (ref 0.00–0.50)

## 2019-07-14 LAB — C-REACTIVE PROTEIN: CRP: <0.8 mg/dL (ref <1.0)

## 2019-07-14 LAB — FERRITIN: Ferritin: 60 ng/mL (ref 11–307)

## 2019-07-14 MED ORDER — MELATONIN 5 MG PO TABS
5.0000 mg | ORAL_TABLET | Freq: Every day | ORAL | Status: DC
  Administered 2019-07-14 – 2019-07-15 (×2): 5 mg via ORAL
  Filled 2019-07-14 (×3): qty 1

## 2019-07-14 NOTE — Progress Notes (Signed)
Pt stable. Husband updated. No further questions at this time. Will update as needed

## 2019-07-14 NOTE — Progress Notes (Signed)
[facility] PROGRESS NOTE    Progress Note  [name]  [id number] DOB: [birth year] DOA: 07/11/2019 PCP: Nicoletta Dress, MD     Brief Narrative:   [name] is an 85 y.o. female past medical history of chronic diastolic heart failure permanent atrial fibrillation on Eliquis, chronic kidney disease 3 was recently discharged from [facility] last month for heart failure and atrial fibrillation discharged to skilled randomly tested for COVID-19 was found positive but remained asymptomatic until the day prior to admission when she started having low-grade fever cough and shortness of breath was placed on oxygen and transferred to [facility] ED, where she was retested and it was negative the [facility] ED physician felt this was a false negative as she had features consistent with COVID-19 and a test on 07/04/2019 was positive and transferred to [facility].  She came from skilled nursing facility [facility] in [place]  Assessment/Plan:   Acute respiratory failure with hypoxia likely due to COVID-19 viral infection: She was found to be hypoxic at [place]. She has been weaned to room air satting greater than 94%. Frail 85 year old female that is not a candidate for intubation or further escalation of care. Continue oral steroids and does appear she has been weaned to room air. Inflammatory markers have remain basically flat.  Permanent atrial fibrillation:  Continue amiodarone, digoxin, and Eliquis.  Chronic diastolic heart failure: She appears to be euvolemic continue current home dose of Lasix.  Dementia without behavioral disturbances: Continue Namenda and Aricept. Very high risk for aspiration and acute delirium.  Aortic insufficiency: Noted.  Goals of care/palliative care: She is a poor candidate for aggressive therapy the admitting physician discussed with the daughter she was made a DNR/DNI.  We will continue gentle treatment.  Memory  loss   DVT prophylaxis: Eliquis Family Communication:daughter Disposition Plan/Barrier to D/C: Once she is done with Remdesivir. Code Status:     Code Status Orders  (From admission, onward)         Start     Ordered   07/11/19 1255  Do not attempt resuscitation (DNR)  Continuous    Question Answer Comment  In the event of cardiac or respiratory ARREST Do not call a code blue   In the event of cardiac or respiratory ARREST Do not perform Intubation, CPR, defibrillation or ACLS   In the event of cardiac or respiratory ARREST Use medication by any route, position, wound care, and other measures to relive pain and suffering. May use oxygen, suction and manual treatment of airway obstruction as needed for comfort.      07/11/19 1254        Code Status History    Date Active Date Inactive Code Status Order ID Comments User Context   07/11/2019 1146 07/11/2019 1254 Full Code [id number]  Jonetta Osgood, MD Inpatient   Advance Care Planning Activity        IV Access:    Peripheral IV   Procedures and diagnostic studies:   No results found.   Medical Consultants:    None.  Anti-Infectives:   Remdesivir  Subjective:    [name] relates no complaints this morning.  Objective:    Vitals:   07/13/19 1500 07/13/19 1925 07/13/19 2355 07/14/19 0400  BP:  (!) 178/77 (!) 161/69 (!) 175/74  Pulse: 74 70 61 63  Resp:  18 20 18   Temp: 97.9 F (36.6 C) 98.1 F (36.7 C) (!) 97.4 F (36.3 C) 97.6  F (36.4 C)  TempSrc: Oral Oral Oral Oral  SpO2: 98%  95% 93%  Weight:      Height:       SpO2: 93 % O2 Flow Rate (L/min): (S) 1 L/min   Intake/Output Summary (Last 24 hours) at 07/14/2019 0737 Last data filed at 07/14/2019 0410 Gross per 24 hour  Intake 258.71 ml  Output 1075 ml  Net -816.29 ml   Filed Weights   07/12/19 0400  Weight: 58.2 kg    Exam: General exam: In no acute distress. Respiratory system: Good air movement and crackles at  bases bilaterally. Cardiovascular system: S1 & S2 heard, RRR. No JVD. Gastrointestinal system: Abdomen is nondistended, soft and nontender.  Central nervous system: Alert and oriented. No focal neurological deficits. Extremities: No pedal edema. Skin: No rashes, lesions or ulcers   Data Reviewed:    Labs: Basic Metabolic Panel: Recent Labs  Lab 07/11/19 1230 07/12/19 0200 07/13/19 0549  NA 136 138 139  K 3.6 4.1 4.8  CL 98 101 101  CO2 25 25 27   GLUCOSE 125* 130* 137*  BUN 21 26* 41*  CREATININE 1.40* 1.30* 1.42*  CALCIUM 8.3* 8.2* 8.6*   GFR Estimated Creatinine Clearance: 22.7 mL/min (A) (by C-G formula based on SCr of 1.42 mg/dL (H)). Liver Function Tests: Recent Labs  Lab 07/11/19 1230 07/12/19 0200 07/13/19 0549  AST 13* 11* 13*  ALT 17 15 17   ALKPHOS 133* 116 118  BILITOT 1.1 0.8 0.7  PROT 6.2* 5.7* 6.4*  ALBUMIN 3.1* 2.8* 3.4*   No results for input(s): LIPASE, AMYLASE in the last 168 hours. No results for input(s): AMMONIA in the last 168 hours. Coagulation profile No results for input(s): INR, PROTIME in the last 168 hours. COVID-19 Labs  Recent Labs    07/11/19 1230 07/12/19 0200 07/13/19 0549  DDIMER 1.01* 0.92* 1.33*  FERRITIN  --  41 52  LDH 193*  --   --   CRP 1.2* 0.9 <0.8    No results found for: SARSCOV2NAA  CBC: Recent Labs  Lab 07/11/19 1230 07/12/19 0200 07/13/19 0549  WBC 5.6 5.6 8.1  NEUTROABS 5.1 5.3 7.3  HGB 10.0* 9.5* 10.6*  HCT 32.1* 30.6* 34.6*  MCV 92.0 92.7 92.5  PLT 306 272 343   Cardiac Enzymes: No results for input(s): CKTOTAL, CKMB, CKMBINDEX, TROPONINI in the last 168 hours. BNP (last 3 results) No results for input(s): PROBNP in the last 8760 hours. CBG: No results for input(s): GLUCAP in the last 168 hours. D-Dimer: Recent Labs    07/12/19 0200 07/13/19 0549  DDIMER 0.92* 1.33*   Hgb A1c: No results for input(s): HGBA1C in the last 72 hours. Lipid Profile: No results for input(s): CHOL, HDL,  LDLCALC, TRIG, CHOLHDL, LDLDIRECT in the last 72 hours. Thyroid function studies: No results for input(s): TSH, T4TOTAL, T3FREE, THYROIDAB in the last 72 hours.  Invalid input(s): FREET3 Anemia work up: Recent Labs    07/12/19 0200 07/13/19 0549  FERRITIN 41 52   Sepsis Labs: Recent Labs  Lab 07/11/19 1230 07/12/19 0200 07/13/19 0549  PROCALCITON <0.10  --   --   WBC 5.6 5.6 8.1   Microbiology Recent Results (from the past 240 hour(s))  MRSA PCR Screening     Status: None   Collection Time: 07/11/19 11:07 PM   Specimen: Nasopharyngeal  Result Value Ref Range Status   MRSA by PCR NEGATIVE NEGATIVE Final    Comment:        The  GeneXpert MRSA Assay (FDA approved for NASAL specimens only), is one component of a comprehensive MRSA colonization surveillance program. It is not intended to diagnose MRSA infection nor to guide or monitor treatment for MRSA infections. Performed at [facility], [place] [address] [place], [place] [address]      Medications:    amiodarone  200 mg Oral BID   amLODipine  5 mg Oral Daily   apixaban  2.5 mg Oral BID   atorvastatin  40 mg Oral QHS   Chlorhexidine Gluconate Cloth  6 each Topical Daily   dexamethasone  6 mg Oral Daily   digoxin  0.125 mg Oral Daily   donepezil  5 mg Oral QHS   ferrous sulfate  325 mg Oral Q breakfast   furosemide  20 mg Oral Daily   memantine  10 mg Oral BID   metoprolol tartrate  12.5 mg Oral BID   potassium chloride SA  40 mEq Oral Daily   risperiDONE  1 mg Oral QHS   sodium chloride flush  3 mL Intravenous Q12H   vitamin C  500 mg Oral Daily   zinc sulfate  220 mg Oral Daily   Continuous Infusions:  sodium chloride     remdesivir 100 mg in NS 250 mL Stopped (07/13/19 1858)      LOS: 3 days   [name]  [facility]  07/14/2019, 7:37 AM

## 2019-07-15 LAB — COMPREHENSIVE METABOLIC PANEL
ALT: 18 U/L (ref 0–44)
AST: 19 U/L (ref 15–41)
Albumin: 3.4 g/dL — ABNORMAL LOW (ref 3.5–5.0)
Alkaline Phosphatase: 102 U/L (ref 38–126)
Anion gap: 12 (ref 5–15)
BUN: 52 mg/dL — ABNORMAL HIGH (ref 8–23)
CO2: 26 mmol/L (ref 22–32)
Calcium: 8.5 mg/dL — ABNORMAL LOW (ref 8.9–10.3)
Chloride: 103 mmol/L (ref 98–111)
Creatinine, Ser: 1.37 mg/dL — ABNORMAL HIGH (ref 0.44–1.00)
GFR calc Af Amer: 39 mL/min — ABNORMAL LOW (ref >60)
GFR calc non Af Amer: 33 mL/min — ABNORMAL LOW (ref >60)
Glucose, Bld: 104 mg/dL — ABNORMAL HIGH (ref 70–99)
Potassium: 5.3 mmol/L — ABNORMAL HIGH (ref 3.5–5.1)
Sodium: 141 mmol/L (ref 135–145)
Total Bilirubin: 0.4 mg/dL (ref 0.3–1.2)
Total Protein: 6 g/dL — ABNORMAL LOW (ref 6.5–8.1)

## 2019-07-15 LAB — CBC WITH DIFFERENTIAL/PLATELET
Abs Immature Granulocytes: 0.05 10*3/uL (ref 0.00–0.07)
Basophils Absolute: 0 10*3/uL (ref 0.0–0.1)
Basophils Relative: 0 %
Eosinophils Absolute: 0 10*3/uL (ref 0.0–0.5)
Eosinophils Relative: 0 %
HCT: 32.5 % — ABNORMAL LOW (ref 36.0–46.0)
Hemoglobin: 9.9 g/dL — ABNORMAL LOW (ref 12.0–15.0)
Immature Granulocytes: 1 %
Lymphocytes Relative: 4 %
Lymphs Abs: 0.3 10*3/uL — ABNORMAL LOW (ref 0.7–4.0)
MCH: 28.6 pg (ref 26.0–34.0)
MCHC: 30.5 g/dL (ref 30.0–36.0)
MCV: 93.9 fL (ref 80.0–100.0)
Monocytes Absolute: 0.5 10*3/uL (ref 0.1–1.0)
Monocytes Relative: 7 %
Neutro Abs: 6.3 10*3/uL (ref 1.7–7.7)
Neutrophils Relative %: 88 %
Platelets: 261 10*3/uL (ref 150–400)
RBC: 3.46 MIL/uL — ABNORMAL LOW (ref 3.87–5.11)
RDW: 14.8 % (ref 11.5–15.5)
WBC: 7.1 10*3/uL (ref 4.0–10.5)
nRBC: 0 % (ref 0.0–0.2)

## 2019-07-15 LAB — FERRITIN: Ferritin: 90 ng/mL (ref 11–307)

## 2019-07-15 LAB — D-DIMER, QUANTITATIVE: D-Dimer, Quant: 2.02 ug/mL-FEU — ABNORMAL HIGH (ref 0.00–0.50)

## 2019-07-15 LAB — C-REACTIVE PROTEIN: CRP: <0.8 mg/dL (ref <1.0)

## 2019-07-15 NOTE — Progress Notes (Signed)
[facility] PROGRESS NOTE    Progress Note  [name]  [id number] DOB: 05/09/1925 DOA: 07/11/2019 PCP: Nicoletta Dress, MD     Brief Narrative:   [name] is an 85 y.o. female past medical history of chronic diastolic heart failure permanent atrial fibrillation on Eliquis, chronic kidney disease 3 was recently discharged from [facility] last month for heart failure and atrial fibrillation discharged to skilled randomly tested for COVID-19 was found positive but remained asymptomatic until the day prior to admission when she started having low-grade fever cough and shortness of breath was placed on oxygen and transferred to [facility] ED, where she was retested and it was negative the [facility] ED physician felt this was a false negative as she had features consistent with COVID-19 and a test on 07/04/2019 was positive and transferred to [facility].  She came from skilled nursing facility [facility] in [place]  Assessment/Plan:   Acute respiratory failure with hypoxia likely due to COVID-19 viral infection: She has been weaned to room air. Continue IV Remdesivir and oral steroids for 5 days. Continue oral steroids and does appear she has been weaned to room air. Inflammatory markers have remain basically flat.  Permanent atrial fibrillation:  Currently rate controlled continue amiodarone digoxin and Eliquis.  Chronic diastolic heart failure: Appears to be euvolemic, continue current home dose of Lasix.  Dementia without behavioral disturbances: High risk for aspiration and acute delirium. Continue Namenda and Aricept.  Aortic insufficiency: Noted.  Goals of care/palliative care: She is a poor candidate for aggressive therapy the admitting physician discussed with the daughter she was made a DNR/DNI.  We will continue gentle treatment.  Memory loss   DVT prophylaxis: Eliquis Family Communication:daughter Disposition Plan/Barrier to  D/C: Skilled nursing facility in the morning. Code Status:     Code Status Orders  (From admission, onward)         Start     Ordered   07/11/19 1255  Do not attempt resuscitation (DNR)  Continuous    Question Answer Comment  In the event of cardiac or respiratory ARREST Do not call a "code blue"   In the event of cardiac or respiratory ARREST Do not perform Intubation, CPR, defibrillation or ACLS   In the event of cardiac or respiratory ARREST Use medication by any route, position, wound care, and other measures to relive pain and suffering. May use oxygen, suction and manual treatment of airway obstruction as needed for comfort.      07/11/19 1254        Code Status History    Date Active Date Inactive Code Status Order ID Comments User Context   07/11/2019 1146 07/11/2019 1254 Full Code [id number]  Jonetta Osgood, MD Inpatient   Advance Care Planning Activity        IV Access:    Peripheral IV   Procedures and diagnostic studies:   No results found.   Medical Consultants:    None.  Anti-Infectives:   Remdesivir  Subjective:    Michaelyn Barter has no complaints this morning.  Objective:    Vitals:   07/14/19 2018 07/14/19 2300 07/15/19 0300 07/15/19 0400  BP: (!) 157/67 110/69 (!) 145/83 (!) 155/64  Pulse: 62 (!) 57 (!) 57 60  Resp: 17   18  Temp: 97.6 F (36.4 C)     TempSrc: Oral     SpO2: 92% 94% 92% 94%  Weight:      Height:  SpO2: 94 % O2 Flow Rate (L/min): (S) 1 L/min   Intake/Output Summary (Last 24 hours) at 07/15/2019 [id number] Last data filed at 07/14/2019 1953 Gross per 24 hour  Intake -  Output 1000 ml  Net -1000 ml   Filed Weights   07/12/19 0400  Weight: 58.2 kg    Exam: General exam: In no acute distress. Respiratory system: Good air movement and diffuse crackles. Cardiovascular system: S1 & S2 heard, RRR. No JVD. Gastrointestinal system: Abdomen is nondistended, soft and nontender.  Central nervous system:  Alert and oriented. No focal neurological deficits. Extremities: No pedal edema. Skin: No rashes, lesions or ulcers Psychiatry: Judgement and insight appear normal. Mood & affect appropriate.    Data Reviewed:    Labs: Basic Metabolic Panel: Recent Labs  Lab 07/11/19 1230 07/12/19 0200 07/13/19 0549 07/14/19 0500  NA 136 138 139 139  K 3.6 4.1 4.8 5.4*  CL 98 101 101 101  CO2 25 25 27 27   GLUCOSE 125* 130* 137* 132*  BUN 21 26* 41* 47*  CREATININE 1.40* 1.30* 1.42* 1.41*  CALCIUM 8.3* 8.2* 8.6* 8.7*   GFR Estimated Creatinine Clearance: 22.9 mL/min (A) (by C-G formula based on SCr of 1.41 mg/dL (H)). Liver Function Tests: Recent Labs  Lab 07/11/19 1230 07/12/19 0200 07/13/19 0549 07/14/19 0500  AST 13* 11* 13* 18  ALT 17 15 17 17   ALKPHOS 133* 116 118 111  BILITOT 1.1 0.8 0.7 1.1  PROT 6.2* 5.7* 6.4* 6.5  ALBUMIN 3.1* 2.8* 3.4* 3.6   No results for input(s): LIPASE, AMYLASE in the last 168 hours. No results for input(s): AMMONIA in the last 168 hours. Coagulation profile No results for input(s): INR, PROTIME in the last 168 hours. COVID-19 Labs  Recent Labs    07/13/19 0549 07/14/19 0500  DDIMER 1.33* 1.91*  FERRITIN 52 60  CRP <0.8 <0.8    No results found for: SARSCOV2NAA  CBC: Recent Labs  Lab 07/11/19 1230 07/12/19 0200 07/13/19 0549 07/14/19 0500  WBC 5.6 5.6 8.1 7.3  NEUTROABS 5.1 5.3 7.3 6.5  HGB 10.0* 9.5* 10.6* 10.9*  HCT 32.1* 30.6* 34.6* 35.4*  MCV 92.0 92.7 92.5 94.7  PLT 306 272 343 271   Cardiac Enzymes: No results for input(s): CKTOTAL, CKMB, CKMBINDEX, TROPONINI in the last 168 hours. BNP (last 3 results) No results for input(s): PROBNP in the last 8760 hours. CBG: No results for input(s): GLUCAP in the last 168 hours. D-Dimer: Recent Labs    07/13/19 0549 07/14/19 0500  DDIMER 1.33* 1.91*   Hgb A1c: No results for input(s): HGBA1C in the last 72 hours. Lipid Profile: No results for input(s): CHOL, HDL, LDLCALC,  TRIG, CHOLHDL, LDLDIRECT in the last 72 hours. Thyroid function studies: No results for input(s): TSH, T4TOTAL, T3FREE, THYROIDAB in the last 72 hours.  Invalid input(s): FREET3 Anemia work up: Recent Labs    07/13/19 0549 07/14/19 0500  FERRITIN 52 60   Sepsis Labs: Recent Labs  Lab 07/11/19 1230 07/12/19 0200 07/13/19 0549 07/14/19 0500  PROCALCITON <0.10  --   --   --   WBC 5.6 5.6 8.1 7.3   Microbiology Recent Results (from the past 240 hour(s))  MRSA PCR Screening     Status: None   Collection Time: 07/11/19 11:07 PM   Specimen: Nasopharyngeal  Result Value Ref Range Status   MRSA by PCR NEGATIVE NEGATIVE Final    Comment:        The GeneXpert MRSA Assay (FDA approved  for NASAL specimens only), is one component of a comprehensive MRSA colonization surveillance program. It is not intended to diagnose MRSA infection nor to guide or monitor treatment for MRSA infections. Performed at [facility], [place] [address], [place], [place] [address]      Medications:   . amiodarone  200 mg Oral BID  . amLODipine  5 mg Oral Daily  . apixaban  2.5 mg Oral BID  . atorvastatin  40 mg Oral QHS  . Chlorhexidine Gluconate Cloth  6 each Topical Daily  . dexamethasone  6 mg Oral Daily  . digoxin  0.125 mg Oral Daily  . donepezil  5 mg Oral QHS  . ferrous sulfate  325 mg Oral Q breakfast  . furosemide  20 mg Oral Daily  . Melatonin  5 mg Oral QHS  . memantine  10 mg Oral BID  . metoprolol tartrate  12.5 mg Oral BID  . risperiDONE  1 mg Oral QHS  . sodium chloride flush  3 mL Intravenous Q12H  . vitamin C  500 mg Oral Daily  . zinc sulfate  220 mg Oral Daily   Continuous Infusions: . sodium chloride    . remdesivir 100 mg in NS 250 mL Stopped (07/14/19 1818)      LOS: 4 days   Charlynne Cousins  [facility]  07/15/2019, 6:52 AM

## 2019-07-15 NOTE — Plan of Care (Signed)
Patient confused, unable to teach risk factors and preventative measures. No family present.

## 2019-07-15 NOTE — TOC Initial Note (Signed)
Transition of Care [facility]) - Initial/Assessment Note    Patient Details  Name: [name] MRN: [id number] Date of Birth: 10-Aug-1925  Transition of Care [facility]) CM/SW Contact:    Weston Anna, LCSW Phone Number: 07/15/2019, 2:54 PM  Clinical Narrative:                  CSW spoke with patients daughter, [name], regarding discharge plans- daughter prefers patient to return to [facility] at time of discharge. CSW reached out to facility and they are prepared to accept patient back once medically stable. Will continue to follow   Expected Discharge Plan: [place] Barriers to Discharge: Continued Medical Work up   Patient Goals and CMS Choice Patient states their goals for this hospitalization and ongoing recovery are:: getting back home      Expected Discharge Plan and Services Expected Discharge Plan: [place] In-house Referral: Clinical Social Work                                            Prior Living Arrangements/Services   Lives with:: Facility Resident   Do you feel safe going back to the place where you live?: Yes               Activities of Daily Living Home Assistive Devices/Equipment: Hospital bed, Environmental consultant (specify type), Other (Comment)(Pt is currently lliving at a SNF) ADL Screening (condition at time of admission) Patient's cognitive ability adequate to safely complete daily activities?: No Is the patient deaf or have difficulty hearing?: No Does the patient have difficulty seeing, even when wearing glasses/contacts?: Yes Does the patient have difficulty concentrating, remembering, or making decisions?: No Patient able to express need for assistance with ADLs?: No Does the patient have difficulty dressing or bathing?: Yes Independently performs ADLs?: No Communication: Independent Dressing (OT): Needs assistance Is this a change from baseline?: Pre-admission baseline Grooming: Needs assistance Is this a  change from baseline?: Pre-admission baseline Feeding: Independent Bathing: Needs assistance Is this a change from baseline?: Pre-admission baseline Toileting: Needs assistance Is this a change from baseline?: Pre-admission baseline In/Out Bed: Needs assistance Is this a change from baseline?: Pre-admission baseline Does the patient have difficulty walking or climbing stairs?: Yes Weakness of Legs: Both Weakness of Arms/Hands: Both  Permission Sought/Granted                  Emotional Assessment           Psych Involvement: No (comment)  Admission diagnosis:  COVID-19 VIRUS INFECTION Patient Active Problem List   Diagnosis Date Noted  . Acute respiratory failure with hypoxia ([place]) 07/12/2019  . Pneumonia due to COVID-19 virus 07/11/2019  . Aortic insufficiency 05/21/2019  . Cognitive deficits 08/07/2013  . Chronic diastolic heart failure ([place]) 03/25/2011  . Acute diastolic heart failure ([place]) 03/14/2011  . DYSPNEA 09/03/2010  . LIMB PAIN 03/03/2010  . Memory loss 09/03/2009  . HYPERLIPIDEMIA 01/25/2009  . HYPERTENSION, BENIGN 01/25/2009  . ATRIAL FIBRILLATION 01/25/2009   PCP:  Nicoletta Dress, MD Pharmacy:   [id number] - [place], [place] [address] Phone: [phone] Fax: [phone]     Social Determinants of Health (SDOH) Interventions    Readmission Risk Interventions No flowsheet data found.

## 2019-07-16 DIAGNOSIS — R531 Weakness: Secondary | ICD-10-CM | POA: Diagnosis not present

## 2019-07-16 DIAGNOSIS — M2550 Pain in unspecified joint: Secondary | ICD-10-CM | POA: Diagnosis not present

## 2019-07-16 DIAGNOSIS — R001 Bradycardia, unspecified: Secondary | ICD-10-CM | POA: Diagnosis not present

## 2019-07-16 DIAGNOSIS — I5032 Chronic diastolic (congestive) heart failure: Secondary | ICD-10-CM | POA: Diagnosis not present

## 2019-07-16 DIAGNOSIS — R441 Visual hallucinations: Secondary | ICD-10-CM | POA: Diagnosis not present

## 2019-07-16 DIAGNOSIS — I517 Cardiomegaly: Secondary | ICD-10-CM | POA: Diagnosis not present

## 2019-07-16 DIAGNOSIS — N179 Acute kidney failure, unspecified: Secondary | ICD-10-CM | POA: Diagnosis not present

## 2019-07-16 DIAGNOSIS — I4891 Unspecified atrial fibrillation: Secondary | ICD-10-CM | POA: Diagnosis not present

## 2019-07-16 DIAGNOSIS — Z7401 Bed confinement status: Secondary | ICD-10-CM | POA: Diagnosis not present

## 2019-07-16 DIAGNOSIS — R600 Localized edema: Secondary | ICD-10-CM | POA: Diagnosis not present

## 2019-07-16 DIAGNOSIS — R440 Auditory hallucinations: Secondary | ICD-10-CM | POA: Diagnosis not present

## 2019-07-16 DIAGNOSIS — I4892 Unspecified atrial flutter: Secondary | ICD-10-CM | POA: Diagnosis not present

## 2019-07-16 DIAGNOSIS — J811 Chronic pulmonary edema: Secondary | ICD-10-CM | POA: Diagnosis not present

## 2019-07-16 DIAGNOSIS — F0150 Vascular dementia without behavioral disturbance: Secondary | ICD-10-CM | POA: Diagnosis not present

## 2019-07-16 DIAGNOSIS — R4182 Altered mental status, unspecified: Secondary | ICD-10-CM | POA: Diagnosis not present

## 2019-07-16 DIAGNOSIS — E871 Hypo-osmolality and hyponatremia: Secondary | ICD-10-CM | POA: Diagnosis not present

## 2019-07-16 DIAGNOSIS — G4700 Insomnia, unspecified: Secondary | ICD-10-CM | POA: Diagnosis not present

## 2019-07-16 DIAGNOSIS — F0390 Unspecified dementia without behavioral disturbance: Secondary | ICD-10-CM | POA: Diagnosis not present

## 2019-07-16 DIAGNOSIS — J9601 Acute respiratory failure with hypoxia: Secondary | ICD-10-CM | POA: Diagnosis not present

## 2019-07-16 DIAGNOSIS — I5033 Acute on chronic diastolic (congestive) heart failure: Secondary | ICD-10-CM | POA: Diagnosis not present

## 2019-07-16 DIAGNOSIS — E785 Hyperlipidemia, unspecified: Secondary | ICD-10-CM | POA: Diagnosis not present

## 2019-07-16 DIAGNOSIS — R410 Disorientation, unspecified: Secondary | ICD-10-CM | POA: Diagnosis not present

## 2019-07-16 DIAGNOSIS — U071 COVID-19: Secondary | ICD-10-CM | POA: Diagnosis not present

## 2019-07-16 DIAGNOSIS — I10 Essential (primary) hypertension: Secondary | ICD-10-CM | POA: Diagnosis not present

## 2019-07-16 DIAGNOSIS — I4821 Permanent atrial fibrillation: Secondary | ICD-10-CM | POA: Diagnosis not present

## 2019-07-16 DIAGNOSIS — Z8619 Personal history of other infectious and parasitic diseases: Secondary | ICD-10-CM | POA: Diagnosis not present

## 2019-07-16 DIAGNOSIS — J1289 Other viral pneumonia: Secondary | ICD-10-CM | POA: Diagnosis not present

## 2019-07-16 DIAGNOSIS — F209 Schizophrenia, unspecified: Secondary | ICD-10-CM | POA: Diagnosis not present

## 2019-07-16 DIAGNOSIS — J90 Pleural effusion, not elsewhere classified: Secondary | ICD-10-CM | POA: Diagnosis not present

## 2019-07-16 DIAGNOSIS — R2681 Unsteadiness on feet: Secondary | ICD-10-CM | POA: Diagnosis not present

## 2019-07-16 DIAGNOSIS — G8929 Other chronic pain: Secondary | ICD-10-CM | POA: Diagnosis not present

## 2019-07-16 LAB — CBC WITH DIFFERENTIAL/PLATELET
Abs Immature Granulocytes: 0.06 10*3/uL (ref 0.00–0.07)
Basophils Absolute: 0 10*3/uL (ref 0.0–0.1)
Basophils Relative: 0 %
Eosinophils Absolute: 0 10*3/uL (ref 0.0–0.5)
Eosinophils Relative: 0 %
HCT: 32.4 % — ABNORMAL LOW (ref 36.0–46.0)
Hemoglobin: 10.1 g/dL — ABNORMAL LOW (ref 12.0–15.0)
Immature Granulocytes: 1 %
Lymphocytes Relative: 3 %
Lymphs Abs: 0.3 10*3/uL — ABNORMAL LOW (ref 0.7–4.0)
MCH: 28.9 pg (ref 26.0–34.0)
MCHC: 31.2 g/dL (ref 30.0–36.0)
MCV: 92.6 fL (ref 80.0–100.0)
Monocytes Absolute: 0.5 10*3/uL (ref 0.1–1.0)
Monocytes Relative: 6 %
Neutro Abs: 6.7 10*3/uL (ref 1.7–7.7)
Neutrophils Relative %: 90 %
Platelets: 260 10*3/uL (ref 150–400)
RBC: 3.5 MIL/uL — ABNORMAL LOW (ref 3.87–5.11)
RDW: 15.1 % (ref 11.5–15.5)
WBC: 7.4 10*3/uL (ref 4.0–10.5)
nRBC: 0 % (ref 0.0–0.2)

## 2019-07-16 LAB — COMPREHENSIVE METABOLIC PANEL
ALT: 19 U/L (ref 0–44)
AST: 14 U/L — ABNORMAL LOW (ref 15–41)
Albumin: 3.1 g/dL — ABNORMAL LOW (ref 3.5–5.0)
Alkaline Phosphatase: 99 U/L (ref 38–126)
Anion gap: 11 (ref 5–15)
BUN: 48 mg/dL — ABNORMAL HIGH (ref 8–23)
CO2: 27 mmol/L (ref 22–32)
Calcium: 8.3 mg/dL — ABNORMAL LOW (ref 8.9–10.3)
Chloride: 105 mmol/L (ref 98–111)
Creatinine, Ser: 1.43 mg/dL — ABNORMAL HIGH (ref 0.44–1.00)
GFR calc Af Amer: 37 mL/min — ABNORMAL LOW (ref >60)
GFR calc non Af Amer: 32 mL/min — ABNORMAL LOW (ref >60)
Glucose, Bld: 126 mg/dL — ABNORMAL HIGH (ref 70–99)
Potassium: 4.6 mmol/L (ref 3.5–5.1)
Sodium: 143 mmol/L (ref 135–145)
Total Bilirubin: 0.7 mg/dL (ref 0.3–1.2)
Total Protein: 5.7 g/dL — ABNORMAL LOW (ref 6.5–8.1)

## 2019-07-16 LAB — FERRITIN: Ferritin: 54 ng/mL (ref 11–307)

## 2019-07-16 LAB — C-REACTIVE PROTEIN: CRP: 0.8 mg/dL (ref <1.0)

## 2019-07-16 LAB — D-DIMER, QUANTITATIVE: D-Dimer, Quant: 1.52 ug/mL-FEU — ABNORMAL HIGH (ref 0.00–0.50)

## 2019-07-16 MED ORDER — AMLODIPINE BESYLATE 5 MG PO TABS: 5.0000 mg | ORAL_TABLET | Freq: Every day | ORAL | Status: AC

## 2019-07-16 NOTE — Progress Notes (Signed)
Have tried multiple times to call report on pt to facility

## 2019-07-16 NOTE — Progress Notes (Signed)
Physical Therapy Treatment Patient Details Name: [name] MRN: [id number] DOB: 1925-09-21 Today's Date: 07/16/2019    History of Present Illness 85 y/o f w/ hx of cognitive deficits, dementia, DM II, HTN,  HLD, mitral regurgitation, tricuspid regurgitation CHF, perm a-fib, CKD III, B shoulder surgeries. 10/01 tested + COVID    PT Comments    THE PATIENT  REPORTS FEELING COLD. ASSISTED INTO RECLINER WITH 1 MOD/MAX ASSIST. CONTINUE PT.  Follow Up Recommendations  SNF     Equipment Recommendations  None recommended by PT    Recommendations for Other Services       Precautions / Restrictions Precautions Precautions: Fall    Mobility  Bed Mobility   Bed Mobility: Supine to Sit     Supine to sit: Max assist     General bed mobility comments: assist wiotjh legs and trunk to sit upright, listing to right.  Transfers Overall transfer level: Needs assistance   Transfers: Stand Pivot Transfers Sit to Stand: Mod assist Stand pivot transfers: Max assist       General transfer comment: bear hiug support to stand up and shuffle to recliner.  Ambulation/Gait                 Stairs             Wheelchair Mobility    Modified Rankin (Stroke Patients Only)       Balance Overall balance assessment: Needs assistance Sitting-balance support: Feet supported;Bilateral upper extremity supported Sitting balance-Leahy Scale: Poor     Standing balance support: Bilateral upper extremity supported Standing balance-Leahy Scale: Poor                              Cognition Arousal/Alertness: Awake/alert Behavior During Therapy: WFL for tasks assessed/performed Overall Cognitive Status: History of cognitive impairments - at baseline                                        Exercises      General Comments        Pertinent Vitals/Pain Pain Assessment: No/denies pain    Home Living                      Prior  Function            PT Goals (current goals can now be found in the care plan section) Progress towards PT goals: Progressing toward goals    Frequency           PT Plan Current plan remains appropriate    Co-evaluation              AM-PAC PT "6 Clicks" Mobility   Outcome Measure  Help needed turning from your back to your side while in a flat bed without using bedrails?: A Lot Help needed moving from lying on your back to sitting on the side of a flat bed without using bedrails?: A Lot Help needed moving to and from a bed to a chair (including a wheelchair)?: A Lot Help needed standing up from a chair using your arms (e.g., wheelchair or bedside chair)?: A Lot Help needed to walk in hospital room?: Total Help needed climbing 3-5 steps with a railing? : Total 6 Click Score: 10    End of Session   Activity Tolerance: Patient limited by  fatigue;Other (comment) Patient left: with call bell/phone within reach;in chair Nurse Communication: Mobility status PT Visit Diagnosis: Other abnormalities of gait and mobility (R26.89);Muscle weakness (generalized) (M62.81)     Time: [id number] PT Time Calculation (min) (ACUTE ONLY): 19 min  Charges:  $Therapeutic Activity: 8-22 mins                     Tresa Endo PT Acute Rehabilitation Services Pager [phone] Office [phone]    Claretha Cooper 07/16/2019, 2:58 PM

## 2019-07-16 NOTE — Discharge Summary (Signed)
Physician Discharge Summary  [name] [id number] DOB: 1924-12-09 DOA: 07/11/2019  PCP: Nicoletta Dress, MD  Admit date: 07/11/2019 Discharge date: 07/16/2019  Admitted From: SNF Disposition:  SNF  Recommendations for Outpatient Follow-up:  1. Follow up with PCP in 1-2 weeks 2. Palliative care to meet with the family at facility to fill out most form and others.  Home Health:no Equipment/Devices:None   Discharge Condition:Stable CODE STATUS:DNR Diet recommendation: Heart Healthy  Brief/Interim Summary: 85 year old with past medical history of chronic diastolic heart failure, permanent atrial fibrillation on Eliquis, chronic kidney disease stage III was recently discharged from [facility] last month for heart failure and atrial fibrillation d discharged to skilled nursing facility.  At the SNF she tested positive for COVID-19 but remained asymptomatic until a few days prior to admission when she started having a low-grade fever and a cough was placed on supplemental oxygen and was referred to [facility]  Discharge Diagnoses:   Acute respiratory failure with hypoxia likely due to COVID-19 viral infection: She was started on supplemental oxygen IV Remdesivir and steroids. She completed a 5-day course inflammatory markers improved throughout her hospital stay. She was ambulated and saturations remained greater than 92% with ambulation. Sickle therapy evaluated the patient and recommended patient go back to skilled nursing facility.  Permanent atrial fibrillation currently rate controlled on amiodarone, digoxin and Eliquis no changes made to her medication.  Chronic diastolic heart failure: Patient appears to be euvolemic no changes made to her medication.  Dementia without behavioral disturbances: She was continue on Namenda and Aricept.  Aortic insufficiency: Noted.  Goals of care/palliative care: She is a poor candidate for intubation and aggressive  treatment. It was discussed with the family who is DNR/DNI and just wanted conservative treatment with minimal medication.    Discharge Instructions  Discharge Instructions    Diet - low sodium heart healthy   Complete by: As directed    Increase activity slowly   Complete by: As directed      Allergies as of 07/16/2019      Reactions   Aspirin Other (See Comments)   Upset stomach      Medication List    TAKE these medications   acetaminophen 325 MG tablet Commonly known as: TYLENOL Take 650 mg by mouth 3 (three) times daily as needed for mild pain or fever.   amiodarone 200 MG tablet Commonly known as: PACERONE Take 200 mg by mouth 2 (two) times daily.   amLODipine 5 MG tablet Commonly known as: NORVASC Take 1 tablet (5 mg total) by mouth daily.   apixaban 2.5 MG Tabs tablet Commonly known as: ELIQUIS Take 2.5 mg by mouth 2 (two) times daily.   atorvastatin 40 MG tablet Commonly known as: LIPITOR Take 40 mg by mouth at bedtime.   digoxin 0.125 MG tablet Commonly known as: LANOXIN Take 0.125 mg by mouth daily.   diltiazem 240 MG 24 hr capsule Commonly known as: CARDIZEM CD Take 240 mg by mouth daily.   donepezil 5 MG tablet Commonly known as: ARICEPT TAKE 1 TABLET BY MOUTH EVERY NIGHT   ferrous sulfate 325 (65 FE) MG tablet Take 325 mg by mouth daily with breakfast.   Fish Oil 1200 MG Caps Take 1 capsule by mouth at bedtime.   furosemide 20 MG tablet Commonly known as: LASIX Take 20 mg by mouth daily.   Melatonin 5 MG Tabs Take 5 mg by mouth at bedtime.   memantine 10 MG tablet Commonly known  as: NAMENDA Take 1 tablet (10 mg total) by mouth 2 (two) times daily.   metoprolol tartrate 25 MG tablet Commonly known as: LOPRESSOR Take 25 mg by mouth 2 (two) times daily.   risperiDONE 1 MG tablet Commonly known as: RISPERDAL Take 1 mg by mouth at bedtime.   warfarin 2 MG tablet Commonly known as: COUMADIN Take 2 mg by mouth daily.       Contact information for after-discharge care    Destination    [facility] Preferred SNF .   Service: Skilled Nursing Contact information: [address] [place] [address] [phone]             Allergies  Allergen Reactions  . Aspirin Other (See Comments)    Upset stomach    Consultations:  None   Procedures/Studies: Portable Chest 1 View  Result Date: 07/11/2019 CLINICAL DATA:  Shortness of breath EXAM: PORTABLE CHEST 1 VIEW COMPARISON:  07/10/2019 FINDINGS: The patient is rotated to the right on today's radiograph, reducing diagnostic sensitivity and specificity. Atherosclerotic calcification of the aortic arch. Stable cardiomegaly. Blunting of both costophrenic angles with indistinctness of the left hemidiaphragm, likely from small bilateral pleural effusions and passive atelectasis. Interstitial accentuation probably from interstitial edema. Healing right upper rib fractures including the fourth and fifth rib and possibly the third rib. Lower right rib fractures are probably old. No pneumothorax. Thoracic spondylosis. Bony demineralization. IMPRESSION: 1. Cardiomegaly with interstitial edema and small bilateral pleural effusions. 2. Healing right upper rib fractures. 3. Bony demineralization. 4. Thoracic spondylosis. Electronically Signed   By: Van Clines M.D.   On: 07/11/2019 15:08    (Echo, Carotid, EGD, Colonoscopy, ERCP)    Subjective:   Discharge Exam: Vitals:   07/15/19 1944 07/16/19 0508  BP: (!) 142/59   Pulse: 65   Resp:  20  Temp: 98 F (36.7 C) 97.8 F (36.6 C)  SpO2: 94%    Vitals:   07/15/19 0400 07/15/19 0800 07/15/19 1944 07/16/19 0508  BP: (!) 155/64 (!) 148/72 (!) 142/59   Pulse: 60  65   Resp: 18 20  20   Temp:  98.4 F (36.9 C) 98 F (36.7 C) 97.8 F (36.6 C)  TempSrc:  Axillary Oral Oral  SpO2: 94%  94%   Weight:      Height:        General: Pt is alert, awake, not in acute  distress Cardiovascular: RRR, S1/S2 +, no rubs, no gallops Respiratory: CTA bilaterally, no wheezing, no rhonchi Abdominal: Soft, NT, ND, bowel sounds + Extremities: no edema, no cyanosis    The results of significant diagnostics from this hospitalization (including imaging, microbiology, ancillary and laboratory) are listed below for reference.     Microbiology: Recent Results (from the past 240 hour(s))  MRSA PCR Screening     Status: None   Collection Time: 07/11/19 11:07 PM   Specimen: Nasopharyngeal  Result Value Ref Range Status   MRSA by PCR NEGATIVE NEGATIVE Final    Comment:        The GeneXpert MRSA Assay (FDA approved for NASAL specimens only), is one component of a comprehensive MRSA colonization surveillance program. It is not intended to diagnose MRSA infection nor to guide or monitor treatment for MRSA infections. Performed at [facility], [place] [address] [place], [place] [address]      Labs: BNP (last 3 results) Recent Labs    07/11/19 1230  BNP 0000000*   Basic Metabolic Panel: Recent  Labs  Lab 07/12/19 0200 07/13/19 0549 07/14/19 0500 07/15/19 0512 07/16/19 0150  NA 138 139 139 141 143  K 4.1 4.8 5.4* 5.3* 4.6  CL 101 101 101 103 105  CO2 25 27 27 26 27   GLUCOSE 130* 137* 132* 104* 126*  BUN 26* 41* 47* 52* 48*  CREATININE 1.30* 1.42* 1.41* 1.37* 1.43*  CALCIUM 8.2* 8.6* 8.7* 8.5* 8.3*   Liver Function Tests: Recent Labs  Lab 07/12/19 0200 07/13/19 0549 07/14/19 0500 07/15/19 0512 07/16/19 0150  AST 11* 13* 18 19 14*  ALT 15 17 17 18 19   ALKPHOS 116 118 111 102 99  BILITOT 0.8 0.7 1.1 0.4 0.7  PROT 5.7* 6.4* 6.5 6.0* 5.7*  ALBUMIN 2.8* 3.4* 3.6 3.4* 3.1*   No results for input(s): LIPASE, AMYLASE in the last 168 hours. No results for input(s): AMMONIA in the last 168 hours. CBC: Recent Labs  Lab 07/12/19 0200 07/13/19 0549 07/14/19 0500 07/15/19 0512 07/16/19 0150  WBC 5.6 8.1 7.3 7.1 7.4  NEUTROABS  5.3 7.3 6.5 6.3 6.7  HGB 9.5* 10.6* 10.9* 9.9* 10.1*  HCT 30.6* 34.6* 35.4* 32.5* 32.4*  MCV 92.7 92.5 94.7 93.9 92.6  PLT 272 343 271 261 260   Cardiac Enzymes: No results for input(s): CKTOTAL, CKMB, CKMBINDEX, TROPONINI in the last 168 hours. BNP: Invalid input(s): POCBNP CBG: No results for input(s): GLUCAP in the last 168 hours. D-Dimer Recent Labs    07/15/19 0512 07/16/19 0150  DDIMER 2.02* 1.52*   Hgb A1c No results for input(s): HGBA1C in the last 72 hours. Lipid Profile No results for input(s): CHOL, HDL, LDLCALC, TRIG, CHOLHDL, LDLDIRECT in the last 72 hours. Thyroid function studies No results for input(s): TSH, T4TOTAL, T3FREE, THYROIDAB in the last 72 hours.  Invalid input(s): FREET3 Anemia work up [employer]    07/15/19 0512 07/16/19 0150  FERRITIN 90 54   Urinalysis No results found for: COLORURINE, APPEARANCEUR, LABSPEC, [place], GLUCOSEU, [place], [place], [place], [place], UROBILINOGEN, NITRITE, LEUKOCYTESUR Sepsis Labs Invalid input(s): PROCALCITONIN,  WBC,  LACTICIDVEN Microbiology Recent Results (from the past 240 hour(s))  MRSA PCR Screening     Status: None   Collection Time: 07/11/19 11:07 PM   Specimen: Nasopharyngeal  Result Value Ref Range Status   MRSA by PCR NEGATIVE NEGATIVE Final    Comment:        The GeneXpert MRSA Assay (FDA approved for NASAL specimens only), is one component of a comprehensive MRSA colonization surveillance program. It is not intended to diagnose MRSA infection nor to guide or monitor treatment for MRSA infections. Performed at [facility], [place] [address], [place], [place] [address]      Time coordinating discharge: Over 40 minutes  SIGNED:   Charlynne Cousins, MD  [facility] 07/16/2019, 7:32 AM Pager   If 7PM-7AM, please contact night-coverage [email or web address] Password [email or web address]

## 2019-07-16 NOTE — TOC Transition Note (Signed)
Transition of Care [facility]) - CM/SW Discharge Note   Patient Details  Name: IEASHIA BOEGEL MRN: [id number] Date of Birth: Nov 20, 1924  Transition of Care [facility]) CM/SW Contact:  Weston Anna, LCSW Phone Number: 07/16/2019, 9:49 AM   Clinical Narrative:     CSW set to discharge back to [facility]- daughter, Jana Half, notified and has no concerns. Please call report to [phone]. [facility] called for transport. No other needs.   Final next level of care: Skilled Nursing Facility Barriers to Discharge: No Barriers Identified   Patient Goals and CMS Choice Patient states their goals for this hospitalization and ongoing recovery are:: getting back home      Discharge Placement              Patient chooses bed at: [facility] Patient to be transferred to facility by: ptar Name of family member notified: Jana Half Patient and family notified of of transfer: 07/16/19  Discharge Plan and Services In-house Referral: Clinical Social Work              DME Arranged: [name]         HH Arranged: NA          Social Determinants of Health (SDOH) Interventions     Readmission Risk Interventions No flowsheet data found.

## 2019-07-18 DIAGNOSIS — F0150 Vascular dementia without behavioral disturbance: Secondary | ICD-10-CM | POA: Diagnosis not present

## 2019-07-18 DIAGNOSIS — I4891 Unspecified atrial fibrillation: Secondary | ICD-10-CM | POA: Diagnosis not present

## 2019-07-18 DIAGNOSIS — I5032 Chronic diastolic (congestive) heart failure: Secondary | ICD-10-CM | POA: Diagnosis not present

## 2019-07-18 DIAGNOSIS — G8929 Other chronic pain: Secondary | ICD-10-CM | POA: Diagnosis not present

## 2019-07-22 DIAGNOSIS — R600 Localized edema: Secondary | ICD-10-CM | POA: Diagnosis not present

## 2019-07-22 DIAGNOSIS — G8929 Other chronic pain: Secondary | ICD-10-CM | POA: Diagnosis not present

## 2019-07-22 DIAGNOSIS — I4891 Unspecified atrial fibrillation: Secondary | ICD-10-CM | POA: Diagnosis not present

## 2019-07-22 DIAGNOSIS — I5032 Chronic diastolic (congestive) heart failure: Secondary | ICD-10-CM | POA: Diagnosis not present

## 2019-07-23 ENCOUNTER — Ambulatory Visit: Payer: Medicare Other | Admitting: Cardiology

## 2019-07-24 DIAGNOSIS — U071 COVID-19: Secondary | ICD-10-CM | POA: Diagnosis not present

## 2019-07-24 DIAGNOSIS — R634 Abnormal weight loss: Secondary | ICD-10-CM | POA: Diagnosis not present

## 2019-07-24 DIAGNOSIS — I110 Hypertensive heart disease with heart failure: Secondary | ICD-10-CM | POA: Diagnosis not present

## 2019-07-24 DIAGNOSIS — F0150 Vascular dementia without behavioral disturbance: Secondary | ICD-10-CM | POA: Diagnosis not present

## 2019-07-24 DIAGNOSIS — I4892 Unspecified atrial flutter: Secondary | ICD-10-CM | POA: Diagnosis not present

## 2019-07-24 DIAGNOSIS — I679 Cerebrovascular disease, unspecified: Secondary | ICD-10-CM | POA: Diagnosis not present

## 2019-07-24 DIAGNOSIS — I4821 Permanent atrial fibrillation: Secondary | ICD-10-CM | POA: Diagnosis not present

## 2019-07-24 DIAGNOSIS — I5032 Chronic diastolic (congestive) heart failure: Secondary | ICD-10-CM | POA: Diagnosis not present

## 2019-07-24 DIAGNOSIS — Z960 Presence of urogenital implants: Secondary | ICD-10-CM | POA: Diagnosis not present

## 2019-07-25 DIAGNOSIS — F0150 Vascular dementia without behavioral disturbance: Secondary | ICD-10-CM | POA: Diagnosis not present

## 2019-07-25 DIAGNOSIS — I110 Hypertensive heart disease with heart failure: Secondary | ICD-10-CM | POA: Diagnosis not present

## 2019-07-25 DIAGNOSIS — R634 Abnormal weight loss: Secondary | ICD-10-CM | POA: Diagnosis not present

## 2019-07-25 DIAGNOSIS — I5032 Chronic diastolic (congestive) heart failure: Secondary | ICD-10-CM | POA: Diagnosis not present

## 2019-07-25 DIAGNOSIS — U071 COVID-19: Secondary | ICD-10-CM | POA: Diagnosis not present

## 2019-07-25 DIAGNOSIS — I679 Cerebrovascular disease, unspecified: Secondary | ICD-10-CM | POA: Diagnosis not present

## 2019-07-26 DIAGNOSIS — U071 COVID-19: Secondary | ICD-10-CM | POA: Diagnosis not present

## 2019-07-26 DIAGNOSIS — R634 Abnormal weight loss: Secondary | ICD-10-CM | POA: Diagnosis not present

## 2019-07-26 DIAGNOSIS — I5032 Chronic diastolic (congestive) heart failure: Secondary | ICD-10-CM | POA: Diagnosis not present

## 2019-07-26 DIAGNOSIS — I110 Hypertensive heart disease with heart failure: Secondary | ICD-10-CM | POA: Diagnosis not present

## 2019-07-26 DIAGNOSIS — I679 Cerebrovascular disease, unspecified: Secondary | ICD-10-CM | POA: Diagnosis not present

## 2019-07-26 DIAGNOSIS — F0150 Vascular dementia without behavioral disturbance: Secondary | ICD-10-CM | POA: Diagnosis not present

## 2019-07-28 DIAGNOSIS — U071 COVID-19: Secondary | ICD-10-CM | POA: Diagnosis not present

## 2019-07-28 DIAGNOSIS — R634 Abnormal weight loss: Secondary | ICD-10-CM | POA: Diagnosis not present

## 2019-07-28 DIAGNOSIS — I110 Hypertensive heart disease with heart failure: Secondary | ICD-10-CM | POA: Diagnosis not present

## 2019-07-28 DIAGNOSIS — I5032 Chronic diastolic (congestive) heart failure: Secondary | ICD-10-CM | POA: Diagnosis not present

## 2019-07-28 DIAGNOSIS — I679 Cerebrovascular disease, unspecified: Secondary | ICD-10-CM | POA: Diagnosis not present

## 2019-07-28 DIAGNOSIS — F0150 Vascular dementia without behavioral disturbance: Secondary | ICD-10-CM | POA: Diagnosis not present

## 2019-08-01 DIAGNOSIS — F0150 Vascular dementia without behavioral disturbance: Secondary | ICD-10-CM | POA: Diagnosis not present

## 2019-08-01 DIAGNOSIS — I4891 Unspecified atrial fibrillation: Secondary | ICD-10-CM | POA: Diagnosis not present

## 2019-08-01 DIAGNOSIS — G8929 Other chronic pain: Secondary | ICD-10-CM | POA: Diagnosis not present

## 2019-08-01 DIAGNOSIS — I5032 Chronic diastolic (congestive) heart failure: Secondary | ICD-10-CM | POA: Diagnosis not present

## 2019-08-04 DIAGNOSIS — F0150 Vascular dementia without behavioral disturbance: Secondary | ICD-10-CM | POA: Diagnosis not present

## 2019-08-04 DIAGNOSIS — R634 Abnormal weight loss: Secondary | ICD-10-CM | POA: Diagnosis not present

## 2019-08-04 DIAGNOSIS — I5032 Chronic diastolic (congestive) heart failure: Secondary | ICD-10-CM | POA: Diagnosis not present

## 2019-08-04 DIAGNOSIS — I110 Hypertensive heart disease with heart failure: Secondary | ICD-10-CM | POA: Diagnosis not present

## 2019-08-04 DIAGNOSIS — I4821 Permanent atrial fibrillation: Secondary | ICD-10-CM | POA: Diagnosis not present

## 2019-08-04 DIAGNOSIS — U071 COVID-19: Secondary | ICD-10-CM | POA: Diagnosis not present

## 2019-08-04 DIAGNOSIS — I4892 Unspecified atrial flutter: Secondary | ICD-10-CM | POA: Diagnosis not present

## 2019-08-04 DIAGNOSIS — Z960 Presence of urogenital implants: Secondary | ICD-10-CM | POA: Diagnosis not present

## 2019-08-04 DIAGNOSIS — I679 Cerebrovascular disease, unspecified: Secondary | ICD-10-CM | POA: Diagnosis not present

## 2019-08-05 DIAGNOSIS — F0150 Vascular dementia without behavioral disturbance: Secondary | ICD-10-CM | POA: Diagnosis not present

## 2019-08-05 DIAGNOSIS — I110 Hypertensive heart disease with heart failure: Secondary | ICD-10-CM | POA: Diagnosis not present

## 2019-08-05 DIAGNOSIS — R634 Abnormal weight loss: Secondary | ICD-10-CM | POA: Diagnosis not present

## 2019-08-05 DIAGNOSIS — U071 COVID-19: Secondary | ICD-10-CM | POA: Diagnosis not present

## 2019-08-05 DIAGNOSIS — I679 Cerebrovascular disease, unspecified: Secondary | ICD-10-CM | POA: Diagnosis not present

## 2019-08-05 DIAGNOSIS — I5032 Chronic diastolic (congestive) heart failure: Secondary | ICD-10-CM | POA: Diagnosis not present

## 2019-08-08 DIAGNOSIS — U071 COVID-19: Secondary | ICD-10-CM | POA: Diagnosis not present

## 2019-08-08 DIAGNOSIS — R634 Abnormal weight loss: Secondary | ICD-10-CM | POA: Diagnosis not present

## 2019-08-08 DIAGNOSIS — I679 Cerebrovascular disease, unspecified: Secondary | ICD-10-CM | POA: Diagnosis not present

## 2019-08-08 DIAGNOSIS — F0150 Vascular dementia without behavioral disturbance: Secondary | ICD-10-CM | POA: Diagnosis not present

## 2019-08-08 DIAGNOSIS — I110 Hypertensive heart disease with heart failure: Secondary | ICD-10-CM | POA: Diagnosis not present

## 2019-08-08 DIAGNOSIS — I5032 Chronic diastolic (congestive) heart failure: Secondary | ICD-10-CM | POA: Diagnosis not present

## 2019-08-09 DIAGNOSIS — U071 COVID-19: Secondary | ICD-10-CM | POA: Diagnosis not present

## 2019-08-09 DIAGNOSIS — I5032 Chronic diastolic (congestive) heart failure: Secondary | ICD-10-CM | POA: Diagnosis not present

## 2019-08-09 DIAGNOSIS — F0150 Vascular dementia without behavioral disturbance: Secondary | ICD-10-CM | POA: Diagnosis not present

## 2019-08-09 DIAGNOSIS — R634 Abnormal weight loss: Secondary | ICD-10-CM | POA: Diagnosis not present

## 2019-08-09 DIAGNOSIS — I110 Hypertensive heart disease with heart failure: Secondary | ICD-10-CM | POA: Diagnosis not present

## 2019-08-09 DIAGNOSIS — I679 Cerebrovascular disease, unspecified: Secondary | ICD-10-CM | POA: Diagnosis not present

## 2019-08-15 ENCOUNTER — Telehealth: Payer: Self-pay | Admitting: Adult Health

## 2019-08-15 ENCOUNTER — Telehealth: Payer: Self-pay | Admitting: Internal Medicine

## 2019-08-15 NOTE — Telephone Encounter (Signed)
Patient daughter called to notify patient has passed away. Please follow up if necessary.

## 2019-08-15 NOTE — Telephone Encounter (Signed)
Noted  

## 2019-08-27 ENCOUNTER — Ambulatory Visit: Payer: Medicare Other | Admitting: Cardiology

## 2019-09-03 DEATH — deceased

## 2019-10-21 ENCOUNTER — Ambulatory Visit: Payer: Medicare Other | Admitting: Adult Health

## 2019-10-29 IMAGING — DX DG CHEST 1V PORT
1 series · 1 of 1 positions shown · non-contrast
Comparison: 07/10/2019

CLINICAL DATA: Shortness of breath

EXAM:
PORTABLE CHEST 1 VIEW

[chest]
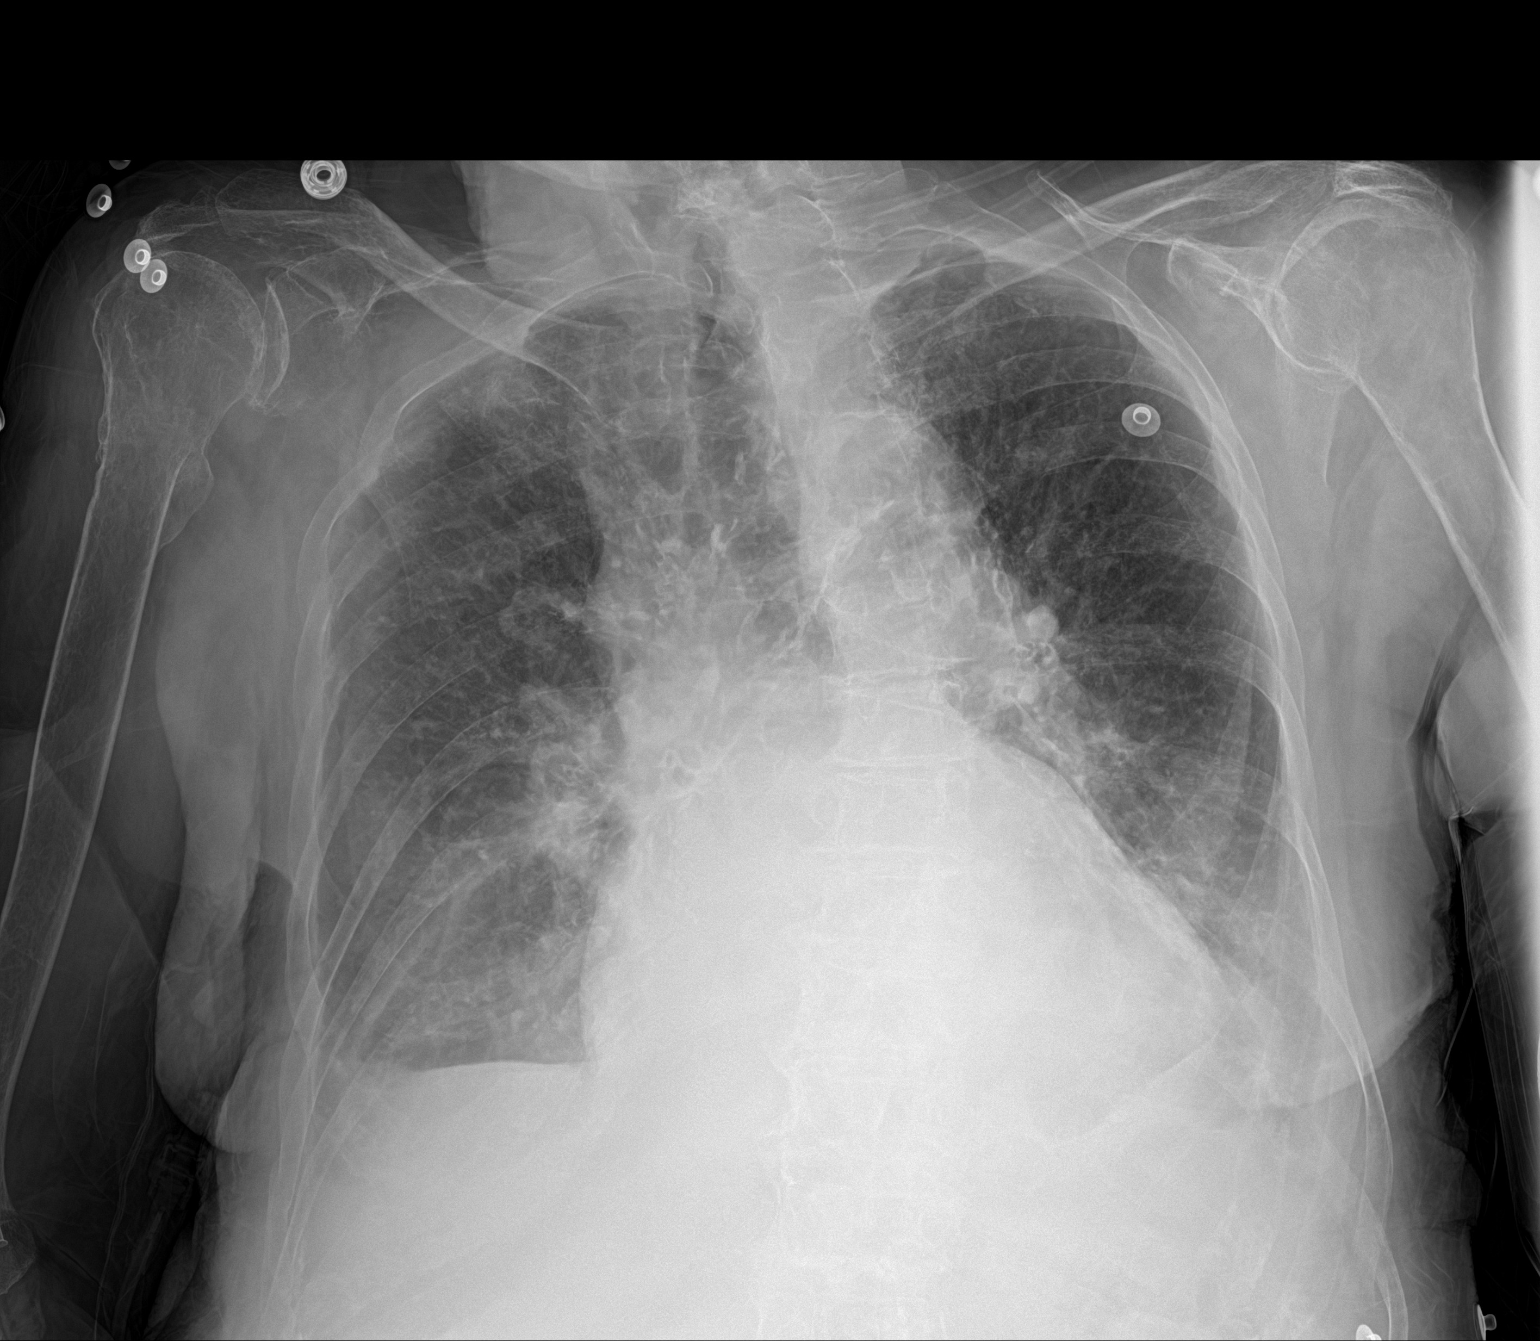

[1 of 1 positions shown; findings below may reference images not displayed]

FINDINGS: The patient is rotated to the right on today's radiograph, reducing
diagnostic sensitivity and specificity. Atherosclerotic
calcification of the aortic arch. Stable cardiomegaly. Blunting of
both costophrenic angles with indistinctness of the left
hemidiaphragm, likely from small bilateral pleural effusions and
passive atelectasis. Interstitial accentuation probably from
interstitial edema.

Healing right upper rib fractures including the fourth and fifth rib
and possibly the third rib. Lower right rib fractures are probably
old. No pneumothorax.

Thoracic spondylosis. Bony demineralization.
IMPRESSION: 1. Cardiomegaly with interstitial edema and small bilateral pleural
effusions.
2. Healing right upper rib fractures.
3. Bony demineralization.
4. Thoracic spondylosis.
